# Patient Record
Sex: Male | Born: 1955 | Race: White | Hispanic: No | Smoking: Former smoker
Health system: Southern US, Community
[De-identification: ages and names within clinical notes are randomized; demographics above are authoritative.]

## PROBLEM LIST (undated history)

## (undated) DIAGNOSIS — I6529 Occlusion and stenosis of unspecified carotid artery: Secondary | ICD-10-CM

## (undated) DIAGNOSIS — J449 Chronic obstructive pulmonary disease, unspecified: Secondary | ICD-10-CM

## (undated) DIAGNOSIS — M79606 Pain in leg, unspecified: Secondary | ICD-10-CM

## (undated) DIAGNOSIS — I739 Peripheral vascular disease, unspecified: Secondary | ICD-10-CM

## (undated) DIAGNOSIS — S31109A Unspecified open wound of abdominal wall, unspecified quadrant without penetration into peritoneal cavity, initial encounter: Secondary | ICD-10-CM

## (undated) DIAGNOSIS — R42 Dizziness and giddiness: Secondary | ICD-10-CM

## (undated) DIAGNOSIS — M79673 Pain in unspecified foot: Secondary | ICD-10-CM

## (undated) DIAGNOSIS — I10 Essential (primary) hypertension: Secondary | ICD-10-CM

## (undated) DIAGNOSIS — C801 Malignant (primary) neoplasm, unspecified: Secondary | ICD-10-CM

## (undated) DIAGNOSIS — E785 Hyperlipidemia, unspecified: Secondary | ICD-10-CM

## (undated) DIAGNOSIS — Z5189 Encounter for other specified aftercare: Secondary | ICD-10-CM

## (undated) HISTORY — DX: Occlusion and stenosis of unspecified carotid artery: I65.29

## (undated) HISTORY — DX: Pain in unspecified foot: M79.673

## (undated) HISTORY — DX: Encounter for other specified aftercare: Z51.89

## (undated) HISTORY — DX: Hyperlipidemia, unspecified: E78.5

## (undated) HISTORY — DX: Chronic obstructive pulmonary disease, unspecified: J44.9

## (undated) HISTORY — DX: Essential (primary) hypertension: I10

## (undated) HISTORY — DX: Peripheral vascular disease, unspecified: I73.9

## (undated) HISTORY — DX: Pain in leg, unspecified: M79.606

---

## 2010-10-24 ENCOUNTER — Ambulatory Visit (HOSPITAL_COMMUNITY)
Admission: RE | Admit: 2010-10-24 | Discharge: 2010-10-24 | Disposition: A | Discharge status: Home / Self Care | Payer: Self-pay | Source: Ambulatory Visit | Attending: Family Medicine | Admitting: Family Medicine

## 2010-10-24 DIAGNOSIS — M79609 Pain in unspecified limb: Secondary | ICD-10-CM | POA: Insufficient documentation

## 2010-10-24 DIAGNOSIS — I70219 Atherosclerosis of native arteries of extremities with intermittent claudication, unspecified extremity: Secondary | ICD-10-CM | POA: Insufficient documentation

## 2010-10-24 DIAGNOSIS — M310 Hypersensitivity angiitis: Secondary | ICD-10-CM

## 2010-10-24 DIAGNOSIS — R0989 Other specified symptoms and signs involving the circulatory and respiratory systems: Secondary | ICD-10-CM

## 2010-11-16 ENCOUNTER — Encounter: Payer: Self-pay | Admitting: Vascular Surgery

## 2010-11-22 ENCOUNTER — Encounter (INDEPENDENT_AMBULATORY_CARE_PROVIDER_SITE_OTHER): Payer: Self-pay | Admitting: Vascular Surgery

## 2010-11-22 DIAGNOSIS — I7092 Chronic total occlusion of artery of the extremities: Secondary | ICD-10-CM

## 2010-11-22 DIAGNOSIS — M79673 Pain in unspecified foot: Secondary | ICD-10-CM

## 2010-11-22 DIAGNOSIS — I6529 Occlusion and stenosis of unspecified carotid artery: Secondary | ICD-10-CM

## 2010-11-22 HISTORY — DX: Pain in unspecified foot: M79.673

## 2010-11-23 NOTE — Assessment & Plan Note (Signed)
OFFICE VISIT  Eric Padilla, Eric Padilla DOB:  02-09-1956                                       11/22/2010 CHART#:[id number]  CHIEF COMPLAINT:  Leg pain.  HISTORY OF PRESENT ILLNESS:  The patient is a 55 year old male referred by Dr. Valente David for evaluation of lower extremity claudication symptoms. The patient complains that he gets bilateral calf pain after walking 1 block.  He states that this has been going on for about a year but has been worse since February of 2012.  He currently smokes half pack of cigarettes per day.  He also was recently started on aspirin 325 mg once a day.  He denies any rest pain.  He denies any prior ulcerations on the feet.  He has no other medical problems listed.  He has had no prior surgical procedures.  SOCIAL HISTORY:  He is single.  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  He has 1 child.  He smokes currently half pack cigarettes per day.  He drinks 2 beers per day.  FAMILY HISTORY:  Not remarkable for vascular disease at young age.  REVIEW OF SYSTEMS:  Multiple joint and muscle pain and leg pain all as listed above.  He is 5 feet 10 inches, 145 pounds.  All other review of systems are negative.  MEDICATIONS:  Aspirin.  ALLERGIES:  No known drug allergies.  PHYSICAL EXAM:  Vital signs:  Blood pressure is 151/72 in the right arm, 147/77 in the left arm, oxygen saturation is 98% on room air, heart rate is 110 and regular.  HEENT:  Unremarkable.  Neck:  Has 2+ carotid pulses with a harsh right-sided bruit.  Cardiac:  Regular rate and rhythm without murmur.  Abdomen:  Soft, nontender, nondistended.  No pulsatile mass.  Musculoskeletal:  No major joint deformities.  Neurologic:  He has symmetric upper extremity and lower extremity motor strength which is 5/5.  Skin:  Has no open ulcers or rashes.  Peripheral vascular:  He has 2+ radial pulses bilaterally.  He has a 2+ right femoral pulse.  He has absent left  femoral pulse.  He has absent popliteal and pedal pulses bilaterally.  The right fifth toe is slightly dusky in appearance. There are no open ulcers on the feet.  I reviewed his bilateral ABIs performed at [facility] on 10/24/2010 which shows an ABI on the right of 0.43 and on the left of 0.58.  These were monophasic waveforms.  He also had a carotid duplex exam which showed bilateral 40%-60% internal carotid artery stenosis with bilateral external carotid artery stenosis.  In summary, as far as the patient's carotid stenosis is concerned it is moderate in nature and he probably needs a repeat carotid duplex exam within the next 6-12 months.  As far as his claudication symptoms are concerned these are fairly disabling to him and he has fairly short distance walking.  I believe the best option at this point would be for an arteriogram/bilateral lower extremity runoff, possible angioplasty and stenting of his lower extremities.  If he needs an operation to revascularize his legs then we would need to consider cardiology evaluation prior to revascularization operation.  Smoking cessation counseling was also given today for greater than 3 minutes.  Risks, benefits, possible complications and procedure details of the arteriogram were explained to the  patient today.  He understands and agrees to proceed.  This is scheduled for 11/30/2010.    Janetta Hora. Fields, MD Electronically Signed  CEF/MEDQ  D:  11/22/2010  T:  11/23/2010  Job:  [id number]  cc:   Dr Wende Bushy

## 2010-11-30 ENCOUNTER — Ambulatory Visit (HOSPITAL_COMMUNITY)
Admission: RE | Admit: 2010-11-30 | Discharge: 2010-12-01 | Disposition: A | Discharge status: Home / Self Care | Payer: Medicaid Other | Source: Ambulatory Visit | Attending: Vascular Surgery | Admitting: Vascular Surgery

## 2010-11-30 ENCOUNTER — Ambulatory Visit (HOSPITAL_COMMUNITY): Payer: Medicaid Other

## 2010-11-30 DIAGNOSIS — I10 Essential (primary) hypertension: Secondary | ICD-10-CM

## 2010-11-30 DIAGNOSIS — Z01810 Encounter for preprocedural cardiovascular examination: Secondary | ICD-10-CM

## 2010-11-30 DIAGNOSIS — I70219 Atherosclerosis of native arteries of extremities with intermittent claudication, unspecified extremity: Secondary | ICD-10-CM

## 2010-11-30 DIAGNOSIS — I708 Atherosclerosis of other arteries: Secondary | ICD-10-CM | POA: Insufficient documentation

## 2010-11-30 DIAGNOSIS — Z01818 Encounter for other preprocedural examination: Secondary | ICD-10-CM | POA: Insufficient documentation

## 2010-11-30 DIAGNOSIS — R079 Chest pain, unspecified: Secondary | ICD-10-CM

## 2010-11-30 LAB — CARDIAC PANEL(CRET KIN+CKTOT+MB+TROPI): Troponin I: <0.3 ng/mL (ref <0.30)

## 2010-11-30 LAB — POCT I-STAT, CHEM 8
Glucose, Bld: 92 mg/dL (ref 70–99)
HCT: 50 % (ref 39.0–52.0)
Hemoglobin: 17 g/dL (ref 13.0–17.0)
Potassium: 4 mEq/L (ref 3.5–5.1)

## 2010-12-01 ENCOUNTER — Ambulatory Visit (HOSPITAL_COMMUNITY): Payer: Medicaid Other

## 2010-12-01 ENCOUNTER — Other Ambulatory Visit (HOSPITAL_COMMUNITY): Payer: Self-pay

## 2010-12-01 LAB — LIPID PANEL
HDL: 42 mg/dL (ref >39)
LDL Cholesterol: 120 mg/dL — ABNORMAL HIGH (ref 0–99)
Triglycerides: 92 mg/dL (ref <150)
VLDL: 18 mg/dL (ref 0–40)

## 2010-12-01 LAB — BASIC METABOLIC PANEL
CO2: 28 mEq/L (ref 19–32)
Chloride: 103 mEq/L (ref 96–112)
Glucose, Bld: 101 mg/dL — ABNORMAL HIGH (ref 70–99)
Potassium: 4.2 mEq/L (ref 3.5–5.1)
Sodium: 138 mEq/L (ref 135–145)

## 2010-12-01 LAB — CARDIAC PANEL(CRET KIN+CKTOT+MB+TROPI): Total CK: 51 U/L (ref 7–232)

## 2010-12-01 LAB — CBC
HCT: 44.6 % (ref 39.0–52.0)
Hemoglobin: 15.5 g/dL (ref 13.0–17.0)
MCH: 33.7 pg (ref 26.0–34.0)
MCHC: 34.8 g/dL (ref 30.0–36.0)

## 2010-12-01 MED ORDER — TECHNETIUM TC 99M TETROFOSMIN IV KIT
30.0000 | PACK | Freq: Once | INTRAVENOUS | Status: AC | PRN
  Administered 2010-12-01: 30 via INTRAVENOUS

## 2010-12-01 MED ORDER — TECHNETIUM TC 99M TETROFOSMIN IV KIT
10.0000 | PACK | Freq: Once | INTRAVENOUS | Status: AC | PRN
  Administered 2010-12-01: 08:00:00 10 via INTRAVENOUS

## 2010-12-11 NOTE — Consult Note (Signed)
Eric Padilla, KERSTEN NO.:  [id number]  MEDICAL RECORD NO.:  [id number]  LOCATION:  6523                         FACILITY:  [facility]  PHYSICIAN:  Cassell Clement, M.D. DATE OF BIRTH:  22-Sep-1955  DATE OF CONSULTATION:  11/30/2010 DATE OF DISCHARGE:                                CONSULTATION   PRIMARY CARE PROVIDER:  [facility].  CARDIOLOGIST:  None.  VASCULAR SURGEON:  Janetta Hora. Fields, MD  We are asked to see this pleasant 55 year old divorced homeless Caucasian male who is being admitted for evaluation of claudication of both legs, but worse on the right.  He will need a femoropopliteal bypass graft surgery following results from his angiogram earlier today. We have been asked to see him for preoperative clearance.  The patient does not have any history of known coronary disease.  He did have an episode of chest pain yesterday for the first time.  It occurred after walking in the heat in the early afternoon.  His pain was in the left side of his chest, lasted about 30 minutes, was associated with dizziness, but no nausea or vomiting.  He was mildly short of breath. He did not have any radiation to the left arm.  He does not have any prior history of known coronary disease.  He states that he does not have any known history of high cholesterol or of high blood pressure, and he is not diabetic.  He used to be a heavy smoker and more recently has been smoking about half-a-pack a day and his trying to quit smoking.  FAMILY HISTORY:  Reveals that his father had coronary artery bypass graft surgery in his late 4s and lived until 4.  The patient's mother died at 71 of a stroke.  SOCIAL HISTORY:  Reveals that he is divorced.  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  He is homeless.  He stayed in the winter emergency shelter until March 31 and then was sleeping out on the streets until last night when he slept at the  [employer] for the first time.  The patient drinks about 2 beers some nights, but not every night.  PAST MEDICAL HISTORY:  Otherwise, noncontributory.  MEDICATIONS:  Daily aspirin.  ALLERGIES:  No known drug allergies.  REVIEW OF SYSTEMS:  All other systems negative in detail.  He does have known carotid bruits, but has not been having TIA symptoms.  PHYSICAL EXAMINATION:  VITAL SIGNS:  Blood pressure is 138/71, pulse is 77 and regular, O2 saturation is 95% on room air. HEENT:  The patient has bilateral carotid bruits, right greater than left.  No lymphadenopathy.  Pupils were equal and reactive.  Extraocular movements are full.  Jugular venous pressure is normal. CHEST:  Clear to percussion and auscultation. HEART:  Reveals soft basilar systolic ejection murmur.  No diastolic murmur.  No gallop or rub. ABDOMEN:  Soft and nontender without masses or hepatomegaly. EXTREMITIES:  Show absent pedal pulses.  His electrocardiogram shows normal sinus rhythm and is within normal limits.  His electrolytes are normal with potassium 4.0, sodium 141, BUN 9, creatinine  0.8, blood sugar 92.  IMPRESSION: 1. Chest pain yesterday, uncertain etiology with no prior history of     known ischemic heart disease. 2. Severe peripheral arterial occlusive disease. 3. Known bilateral carotid bruits. 4. Tobacco abuse. 5. Homelessness  PLAN:  We will arrange for a Lexiscan Myoview for a preoperative clearance and this is on the schedule for Saturday morning, June 30.  We will also request a 2-dimensional echocardiogram.  We will check one set of cardiac enzymes in view of yesterday's chest pain.  We will also check lipid status.  At this point, we will continue aspirin, and if he has a recurrence of pain, we will give him a trial of sublingual nitroglycerin.          ______________________________ Cassell Clement, M.D.     TB/MEDQ  D:  11/30/2010  T:  12/01/2010  Job:  [id number]  cc:    Janetta Hora. Fields, MD [facility]  Electronically Signed by Cassell Clement M.D. on 12/11/2010 08:25:55 AM

## 2010-12-17 NOTE — Op Note (Signed)
Eric Padilla, Eric Padilla NO.:  [id number]  MEDICAL RECORD NO.:  [id number]  LOCATION:  6523                         FACILITY:  [facility]  PHYSICIAN:  Janetta Hora. Esta Carmon, MD  DATE OF BIRTH:  May 26, 1956  DATE OF PROCEDURE:  11/30/2010 DATE OF DISCHARGE:                              OPERATIVE REPORT   PROCEDURE:  Aortogram with bilateral lower extremity runoff.  PREOPERATIVE DIAGNOSIS:  Bilateral lower extremity claudication.  POSTOPERATIVE DIAGNOSIS:  Bilateral lower extremity claudication.  ANESTHESIA:  Local with IV sedation.  OPERATIVE DETAILS:  After obtaining informed consent, the patient taken to the PV Lab.  The patient placed in supine position on the angio table.  Both groins were prepped and draped in the usual sterile fashion.  Local anesthesia was infiltrated over the right common femoral artery.  An introducer needle was used to cannulate the right common femoral artery, and a 0.035 J-tip guidewire threaded into the abdominal aorta under fluoroscopic guidance.  Next, a 5-French sheath placed over the guidewire in the right common femoral artery, and this was thoroughly flushed with heparinized saline.  A 5-French pigtail catheter was then placed over the guidewire and into the abdominal aorta and abdominal aortogram was obtained.  This shows bilateral single renal arteries, which are patent.  There is mild-to-moderate atherosclerotic change, but no focal narrowing of the abdominal aorta.  The left common iliac artery is occluded just after its origin.  The right common iliac artery is calcified but patent.  The right external and internal iliac artery is patent.  The left internal iliac artery does fill retrograde from the left external iliac artery.  The left external iliac artery is filled and reconstituted distally primarily by lumbar collaterals.  Bilateral lower extremity runoff views were obtained after obtaining 30- degree RAO and LAO views of  the pelvis, which confirmed the above findings.  In the lower extremity runoff views, the right common femoral artery is patent.  The right profunda femoris artery has a 90% stenosis at its origin.  This is of a secondary branch, however.  The main profunda trunk is widely patent.  The right superficial femoral artery occludes at its origin.  The right superficial femoral artery reconstitutes via profunda collaterals in the mid leg.  The above-knee popliteal artery is patent.  The vessel is of better quality at the level of the femoral condyle.  The below-knee popliteal artery is patent.  There is three- vessel runoff to the right foot.  This was primarily via the peroneal artery.  There is sluggish flow in the posterior tibial and anterior tibial artery.  In the left lower extremity, the left common femoral artery is patent. The left profunda femoris and superficial femoral arteries are calcified but patent.  There is some narrowing of the tibioperoneal trunk on the left side.  However, there is three-vessel runoff to the left foot,although there is under filling most likely due to the left common iliac artery occlusion.  The patient tolerated the procedure well and there were no complications.  The pigtail catheter was removed over a guidewire.  The 5-French sheath was left in place to be pulled in  the holding area.  OPERATIVE FINDINGS: 1. Occlusion of left common iliac artery. 2. In-line flow other than the common iliac occlusion in the left     lower extremity with three-vessel runoff to the left foot.  Right superficial femoral artery occlusion with three-vessel runoff to the right foot with sluggish flow in the anterior tibial artery to the right foot.  The patient will be scheduled for a femoral-femoral bypass and right fem- pop bypass in the near future after cardiac risk stratification. [facility] was contacted regarding this today.  The patient will spend the  night, tonight in the hospital since he is homeless and does not have anywhere to stay for this evening.     Janetta Hora. Candy Ziegler, MD     CEF/MEDQ  D:  11/30/2010  T:  12/01/2010  Job:  [id number]  Electronically Signed by Fabienne Bruns MD on 12/17/2010 09:52:41 AM

## 2010-12-27 ENCOUNTER — Encounter: Payer: Self-pay | Admitting: Cardiology

## 2011-01-01 ENCOUNTER — Encounter (HOSPITAL_COMMUNITY)
Admission: RE | Admit: 2011-01-01 | Discharge: 2011-01-01 | Disposition: A | Discharge status: Home / Self Care | Payer: Medicaid Other | Source: Ambulatory Visit | Attending: Vascular Surgery | Admitting: Vascular Surgery

## 2011-01-01 ENCOUNTER — Ambulatory Visit (HOSPITAL_COMMUNITY)
Admission: RE | Admit: 2011-01-01 | Discharge: 2011-01-01 | Disposition: A | Discharge status: Home / Self Care | Payer: Medicaid Other | Source: Ambulatory Visit | Attending: Vascular Surgery | Admitting: Vascular Surgery

## 2011-01-01 ENCOUNTER — Other Ambulatory Visit: Payer: Self-pay | Admitting: Vascular Surgery

## 2011-01-01 DIAGNOSIS — Z01818 Encounter for other preprocedural examination: Secondary | ICD-10-CM | POA: Insufficient documentation

## 2011-01-01 DIAGNOSIS — I739 Peripheral vascular disease, unspecified: Secondary | ICD-10-CM

## 2011-01-01 LAB — CBC
HCT: 44.9 % (ref 39.0–52.0)
Hemoglobin: 15.9 g/dL (ref 13.0–17.0)
MCH: 34.3 pg — ABNORMAL HIGH (ref 26.0–34.0)
MCHC: 35.4 g/dL (ref 30.0–36.0)
MCV: 96.8 fL (ref 78.0–100.0)
Platelets: 349 K/uL (ref 150–400)
RBC: 4.64 MIL/uL (ref 4.22–5.81)
RDW: 14 % (ref 11.5–15.5)
WBC: 10.3 K/uL (ref 4.0–10.5)

## 2011-01-01 LAB — ABO/RH: ABO/RH(D): A NEG

## 2011-01-01 LAB — COMPREHENSIVE METABOLIC PANEL WITH GFR
ALT: 17 U/L (ref 0–53)
AST: 16 U/L (ref 0–37)
Albumin: 3.9 g/dL (ref 3.5–5.2)
Alkaline Phosphatase: 111 U/L (ref 39–117)
BUN: 13 mg/dL (ref 6–23)
CO2: 27 meq/L (ref 19–32)
Calcium: 10.3 mg/dL (ref 8.4–10.5)
Chloride: 104 meq/L (ref 96–112)
Creatinine, Ser: 0.61 mg/dL (ref 0.50–1.35)
GFR calc Af Amer: >60 mL/min
GFR calc non Af Amer: >60 mL/min
Glucose, Bld: 77 mg/dL (ref 70–99)
Potassium: 5.2 meq/L — ABNORMAL HIGH (ref 3.5–5.1)
Sodium: 141 meq/L (ref 135–145)
Total Bilirubin: 0.3 mg/dL (ref 0.3–1.2)
Total Protein: 7 g/dL (ref 6.0–8.3)

## 2011-01-01 LAB — URINALYSIS, ROUTINE W REFLEX MICROSCOPIC
Bilirubin Urine: NEGATIVE
Glucose, UA: NEGATIVE mg/dL
Hgb urine dipstick: NEGATIVE
Ketones, ur: NEGATIVE mg/dL
Leukocytes, UA: NEGATIVE
Nitrite: NEGATIVE
Protein, ur: NEGATIVE mg/dL
Specific Gravity, Urine: 1.021 (ref 1.005–1.030)
Urobilinogen, UA: 1 mg/dL (ref 0.0–1.0)
pH: 7.5 (ref 5.0–8.0)

## 2011-01-01 LAB — PROTIME-INR: Prothrombin Time: 12.9 seconds (ref 11.6–15.2)

## 2011-01-07 ENCOUNTER — Inpatient Hospital Stay (HOSPITAL_COMMUNITY): Payer: Medicaid Other

## 2011-01-07 ENCOUNTER — Inpatient Hospital Stay (HOSPITAL_COMMUNITY)
Admission: RE | Admit: 2011-01-07 | Discharge: 2011-01-11 | DRG: 253 | Disposition: A | Discharge status: Home / Self Care | Payer: Medicaid Other | Source: Ambulatory Visit | Attending: Vascular Surgery | Admitting: Vascular Surgery

## 2011-01-07 ENCOUNTER — Other Ambulatory Visit: Payer: Self-pay | Admitting: Vascular Surgery

## 2011-01-07 DIAGNOSIS — Z5900 Homelessness unspecified: Secondary | ICD-10-CM

## 2011-01-07 DIAGNOSIS — Z7982 Long term (current) use of aspirin: Secondary | ICD-10-CM

## 2011-01-07 DIAGNOSIS — I10 Essential (primary) hypertension: Secondary | ICD-10-CM | POA: Diagnosis present

## 2011-01-07 DIAGNOSIS — I498 Other specified cardiac arrhythmias: Secondary | ICD-10-CM | POA: Diagnosis not present

## 2011-01-07 DIAGNOSIS — I70219 Atherosclerosis of native arteries of extremities with intermittent claudication, unspecified extremity: Principal | ICD-10-CM | POA: Diagnosis present

## 2011-01-07 DIAGNOSIS — Z01810 Encounter for preprocedural cardiovascular examination: Secondary | ICD-10-CM

## 2011-01-07 DIAGNOSIS — Z01812 Encounter for preprocedural laboratory examination: Secondary | ICD-10-CM

## 2011-01-07 DIAGNOSIS — F17200 Nicotine dependence, unspecified, uncomplicated: Secondary | ICD-10-CM | POA: Diagnosis present

## 2011-01-07 DIAGNOSIS — Z79899 Other long term (current) drug therapy: Secondary | ICD-10-CM

## 2011-01-07 DIAGNOSIS — D62 Acute posthemorrhagic anemia: Secondary | ICD-10-CM | POA: Diagnosis not present

## 2011-01-07 HISTORY — PX: PR VEIN BYPASS GRAFT,AORTO-FEM-POP: 35551

## 2011-01-08 LAB — BASIC METABOLIC PANEL
BUN: 7 mg/dL (ref 6–23)
CO2: 27 mEq/L (ref 19–32)
Chloride: 102 mEq/L (ref 96–112)
Creatinine, Ser: <0.47 mg/dL — ABNORMAL LOW (ref 0.50–1.35)
Glucose, Bld: 140 mg/dL — ABNORMAL HIGH (ref 70–99)
Potassium: 3.7 mEq/L (ref 3.5–5.1)

## 2011-01-08 LAB — CBC
HCT: 26.2 % — ABNORMAL LOW (ref 39.0–52.0)
Hemoglobin: 9.2 g/dL — ABNORMAL LOW (ref 13.0–17.0)
MCV: 97.4 fL (ref 78.0–100.0)
RBC: 2.69 MIL/uL — ABNORMAL LOW (ref 4.22–5.81)
WBC: 9.5 10*3/uL (ref 4.0–10.5)

## 2011-01-09 DIAGNOSIS — I739 Peripheral vascular disease, unspecified: Secondary | ICD-10-CM

## 2011-01-09 LAB — CBC
MCH: 33.5 pg (ref 26.0–34.0)
MCHC: 34.6 g/dL (ref 30.0–36.0)
MCV: 96.8 fL (ref 78.0–100.0)
Platelets: 227 10*3/uL (ref 150–400)
RBC: 2.51 MIL/uL — ABNORMAL LOW (ref 4.22–5.81)

## 2011-01-10 LAB — POCT I-STAT 7, (LYTES, BLD GAS, ICA,H+H)
O2 Saturation: 100 %
Potassium: 4.6 mEq/L (ref 3.5–5.1)
Sodium: 139 mEq/L (ref 135–145)
TCO2: 26 mmol/L (ref 0–100)

## 2011-01-10 LAB — CBC
HCT: 21.1 % — ABNORMAL LOW (ref 39.0–52.0)
MCHC: 34.6 g/dL (ref 30.0–36.0)
MCV: 96.8 fL (ref 78.0–100.0)
Platelets: 217 10*3/uL (ref 150–400)
RDW: 13.9 % (ref 11.5–15.5)

## 2011-01-11 LAB — TYPE AND SCREEN
ABO/RH(D): A NEG
Unit division: 0

## 2011-01-11 LAB — CBC
HCT: 29.2 % — ABNORMAL LOW (ref 39.0–52.0)
Hemoglobin: 10.2 g/dL — ABNORMAL LOW (ref 13.0–17.0)
MCH: 32.7 pg (ref 26.0–34.0)
MCHC: 34.9 g/dL (ref 30.0–36.0)
MCV: 93.6 fL (ref 78.0–100.0)

## 2011-01-21 DIAGNOSIS — I70219 Atherosclerosis of native arteries of extremities with intermittent claudication, unspecified extremity: Secondary | ICD-10-CM

## 2011-01-29 ENCOUNTER — Encounter: Payer: Self-pay | Admitting: Thoracic Diseases

## 2011-01-30 ENCOUNTER — Encounter: Payer: Self-pay | Admitting: Thoracic Diseases

## 2011-01-31 ENCOUNTER — Ambulatory Visit (INDEPENDENT_AMBULATORY_CARE_PROVIDER_SITE_OTHER): Payer: PRIVATE HEALTH INSURANCE | Admitting: Thoracic Diseases

## 2011-01-31 VITALS — BP 138/111 | HR 118 | Resp 18 | Ht 71.0 in | Wt 145.0 lb

## 2011-01-31 DIAGNOSIS — I739 Peripheral vascular disease, unspecified: Secondary | ICD-10-CM

## 2011-01-31 NOTE — Op Note (Signed)
NAMEJAKIE, Eric Padilla NO.:  [id number]  MEDICAL RECORD NO.:  [id number]  LOCATION:  2027                         FACILITY:  [facility]  PHYSICIAN:  Janetta Hora. Fields, MD  DATE OF BIRTH:  18-May-1956  DATE OF PROCEDURE:  01/07/2011 DATE OF DISCHARGE:                              OPERATIVE REPORT   PROCEDURES: 1. Left-to-right femoral-femoral bypass with 8-mm Dacron. 2. Right femoral-to-above-knee popliteal bypass with reversed right     greater saphenous vein. 3. Right popliteal endarterectomy. 4. Patch angioplasty of right popliteal artery with vein.  PREOPERATIVE DIAGNOSIS:  Claudication, bilateral lower extremity.  POSTOPERATIVE DIAGNOSIS:  Claudication, bilateral lower extremity.  ANESTHESIA:  General.  SURGEON:  Charles E. Fields, MD  ASSISTANT:  Della Goo, PA-C and Di Kindle. Edilia Bo, MD  OPERATIVE FINDINGS: 1. A 3 mm right greater saphenous vein. 2. A 8-mm Dacron. 3. Three-vessel runoff to the right foot, although there is some     disease in the below-knee popliteal artery in the proximal tibial     vessels.  OPERATIVE DETAILS:  After obtaining informed consent, the patient was taken to the operating room.  The patient was placed in supine position on the operating table.  After induction of general anesthesia and endotracheal intubation, the patient was prepped and draped in usual sterile fashion from the umbilicus down to the toes bilaterally.  Next, an incision was made in longitudinal fashion on the right groin carried down through the subcutaneous tissues down to the level of the right common femoral artery.  This had a good pulse within it.  There was some posterior plaque.  The artery was dissected free circumferentially and the profunda femoris and two superficial femoral arteries were also dissected free circumferentially.  Dissection was carried up to the level of the inguinal ligament and the crossing venous branch over the  common femoral artery was dissected free circumferentially and ligated and divided between the silk ties.  Next, attention was turned to the left groin.  In similar fashion, longitudinal incision was made over the left common femoral artery, carried down through the subcutaneous tissues down to the level of the artery.  The artery was dissected free circumferentially.  There is calcified plaque proximally in the artery.  The artery was soft in the midportion of the common femoral artery.  The profunda femoris and superficial femoral arteries were dissected free circumferentially and vessel loops were placed around these.  Attention was then turned back to the right groin.  The right greater saphenous vein was then harvested through several skip incisions on the groin on the right leg from the saphenofemoral junction all the way down to the level of the knee.  The groin vein was of good quality, approximately 3 mm in diameter.  Small side branches of vein were ligated and divided between the silk ties or clips.  Next, the incision just above the knee from the saphenectomy site was deepened down into the above-knee popliteal space.  The above- knee popliteal artery was dissected free circumferentially and vessel loops were placed around this.  The artery was quite thickened and diseased in its external appearance.  However, there  were soft segments within it.  A subcutaneous tunnel was then created connecting the right groin incision to the left groin incision and a 8-mm Dacron graft was brought through the subcutaneous tunnel.  The patient was then given 7000 units of intravenous heparin.  The patient was also given several additional boluses of heparin during the course of the case.  Please see anesthesia record for details regarding the heparin dosing.  After appropriate circulation time of the heparin, the right common femoral artery was occluded proximally with the Cooley clamp.   The vessel loops were used to control the profunda femoris and superficial femoral arteries.  A longitudinal opening was made in the common femoral artery and the graft was beveled on one end and sewn end of graft to side of artery using a running 5-0 Prolene suture.  Just prior to completion of the anastomosis, this was forebled, backbled and thoroughly flushed.  Anastomosis was secured.  Clamps were released. There was a good pulsatile flow in the graft immediately.  Attention was then turned to the left groin.  In similar fashion, the common femoral arteries were controlled initially with a Cooley clamp.  This did not have good hemostasis, controlled with fistula clamp.  The profunda and superficial femoral arteries were controlled with vessel loops. Longitudinal opening was made in the common femoral artery.  The graft was pulled taut, cut to length, and beveled and sewn end graft to side of artery using a running 5-0 Prolene suture.  Just prior to completion of the anastomosis, this was forebled, backbled and thoroughly flushed. Anastomosis was secured.  Clamps were released.  There was good pulsatile flow in the femoral artery immediately.  Thrombin and Gelfoam were packed in both groin incisions.  Attention was then turned back to the right groin.  After hemostasis had been obtained in the right groin, the femoral-femoral bypass was controlled with a fistula clamp and the common femoral artery was re-controlled with a Cooley clamp and the profunda femoris and superficial femoral arteries were controlled distally with vessel loops.  Longitudinal opening was made in the hood of the femoral-femoral bypass graft.  The vein was transected at the level of the knee joint and oversewn with a 5-0 Prolene at the saphenofemoral junction.  The vein was placed in the reverse configuration and spatulated.  It was then sewn end of vein to side of the hood of the femoral-femoral graft in the right  groin using a running 6-0 Prolene suture.  Just prior to completion of the anastomosis, this was forebled, backbled and thoroughly flushed.  Anastomosis was secured. Clamps were released.  There was pulsatile flow in the vein graft immediately.  This was marked for orientation.  The deep tunnel was then created from the above-knee popliteal space to the groin below the sartorius muscle.  The bypass graft was brought through this tunnel and attention was turned to the above-knee popliteal artery.  This was controlled proximally and distally with fine bulldog clamps.  A longitudinal opening was made in the above-knee popliteal artery.  There was some posterior wall plaque, but I was able to pass up to a 4.5-mm dilator through the artery and thought this was a reasonable segment for the distal anastomosis.  The vein graft was cut to length and the vein sewn end of vein to side of artery using a running 6-0 Prolene suture. Just prior to completion of the anastomosis, this was forebled, backbled and thoroughly flushed.  Anastomosis was secured.  Clamps were released. There was pulsatile flow in the graft immediately, but the pulse distal to the graft was not very good quality.  There were Doppler signals in the posterior tibial and dorsalis pedis artery.  At this point, an intraoperative arteriogram was performed by introducing a 21-gauge butterfly needle in the proximal aspect of the vein graft and with inflow occlusion.  Arteriogram was performed injecting contrast through the vein graft.  The distal anastomosis was visualized and there was exophytic plaque, which was obstructing approximately 80% of the limb into the bypass.  I did not feel that this was going to be good for long term patency.  Therefore, attention was turned back to the distal anastomosis.  The patient was re-bolused with heparin.  The vein graft was controlled proximally and a longitudinal opening was made in the hood of  the vein graft and extended down onto the artery.  There was exophytic plaque along the posterior wall and the artery was fairly diseased with thickened atherosclerotic plaque.  I then proceeded to do a popliteal endarterectomy to remove this focal segment of atherosclerotic plaque.  Fairly reasonable distal endpoint was obtained, but I did tack this down posteriorly with several 7-0 Prolene sutures. An additional piece of vein was harvested from an incision just below the knee and this was placed in a reverse configuration and opened longitudinally and then sewn on as a vein patch with a patch extending from the distal above-the-knee popliteal artery all the way up onto the hood of the preexisting vein graft.  Just prior to completion of the anastomosis, everything was forebled, backbled and thoroughly flushed. Anastomosis was secured.  Clamps were released.  There was good pulsatile flow in the graft immediately.  A repeat arteriogram was then performed, which showed a widely patent distal anastomosis with three- vessel runoff, although there were some atherosclerotic occlusive disease in the proximal aspect of the tibial vessels as well as the distal below-the-knee popliteal artery.  At this point, hemostasis was obtained in all incisions.  Groins were both closed with multiple layers of running 2-0 and 3-0 Vicryl suture and 4-0 Vicryl subcuticular stitch in the skin.  The subcutaneous tissues of the saphenectomy site were closed with multiple layers of running 3-0 Vicryl suture and 4-0 Vicryl subcuticular stitch in the skin.  The above-the-knee popliteal incision was closed with a running 2-0 Vicryl suture in the deep layer and then a 3-0 subcutaneous layer and a 4-0 Vicryl subcuticular stitch in the skin. Dermabond was applied to the incisions.  The patient tolerated the procedure well and there were no complications.  Instrument, sponge, and needle count was correct at the end of the  case.  The patient had posterior tibial and dorsalis pedis Doppler signals at the end of the case in both feet.     Janetta Hora. Fields, MD     CEF/MEDQ  D:  01/08/2011  T:  01/08/2011  Job:  [id number]  Electronically Signed by Fabienne Bruns MD on 01/31/2011 06:26:00 PM

## 2011-01-31 NOTE — Patient Instructions (Signed)
See letter under communication for wound care

## 2011-01-31 NOTE — Progress Notes (Signed)
[facility] [place]  Postoperative Visit Bypass Surgery  History of Present Illness  Eric Padilla is a 55 y.o. male who presents for postoperative follow-up for: left to right Fem- Fem bypass and right fem popVascular Bypass with vein on (Date: 01/07/11).  The patient's wounds are healed except for the distal harvest site wound which has been draining"bloody fluid" since last week. The pt. Has been putting dry guaze on it. He denies fever or chills. He is still having some incisional pain but states his legs feel overall better than pre-op.  Patients pain is well controlled.  The patient's pre-operative symptoms of pain and claudication are Improved.  Physical Examination  Filed Vitals:   01/31/11 1334  BP: 138/111  Pulse: 118  Resp: 18    Pt is A&O x 3 Gait is normal right lower extremity:distal thigh Incision/s is erythematous, draining and partially separated with approx 2x3 cm pocket, there is and area of induration with no erythema around this wound.  Probing the wound and opening of it produced minimal serous drainage. The other wounds are healing without signs of infection. Both groin wounds are healing well. The fem-fem graft is palp.  Dorsalis Pedis pulse is present and palpable bilaterally  Medical Decision Making  Eric Padilla is a 54 y.o. year old male who presents s/p left to right fem fem bypass and right lower extremity fem-pop bypass surgery .  The patient's bypass incisions are healed with exception of distal thigh wound on the right with open draining area  He hasgood resolution of pre-operative symptoms.  We will request [facility] nursing for wet to dry dressing changes daily- this may be difficult as Pt. Is homeless and lives at the [facility].facility. Pt was given a letter for the facility at which he is residing. He states there is a nurse there who can help set up his care.  We will see him again next week to assess wound.  Cipro 500mg   BID x 10 days  vicodin 5/500mg  # 20 given for pain

## 2011-01-31 NOTE — Discharge Summary (Signed)
NAME:  Eric Padilla, Eric Padilla NO.:  [id number]  MEDICAL RECORD NO.:  [id number]  LOCATION:                                 FACILITY:  PHYSICIAN:  Charles E. Fields, MD  DATE OF BIRTH:  January 29, 1956  DATE OF ADMISSION:  01/07/2011 DATE OF DISCHARGE:                        DISCHARGE SUMMARY - REFERRING   ADMIT DIAGNOSIS:  Bilateral lower extremity claudication, right greater than left with peripheral arterial disease.  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  ALLERGIES:  No known drug allergies.  BRIEF HISTORY:  The patient is a 55 year old male who was referred to Dr. Darrick Penna by Dr. Valente David for evaluation of lower extremity claudication symptoms.  The patient complained that he had bilateral calf pain after walking one block.  He stated that has been present for about a year and had gotten worse since February 2012.  The patient does smoke half pack a day.  After evaluation in the office by Dr. Darrick Penna, he felt that the patient should proceed with arteriogram to evaluate the severity of his PAD.  This was then performed on November 30, 2010 by Dr. Darrick Penna.  This revealed an occlusion of the left common iliac artery as well as right superficial femoral artery occlusion with three-vessel runoff to the foot.  The patient was therefore scheduled for surgical revascularization.  HOSPITAL COURSE:  The patient was admitted and taken to the OR on January 07, 2011 for a left-to-right femoral-femoral bypass with an 8-mm Dacron, right femoral to above-knee popliteal bypass with reverse greater saphenous vein, right popliteal artery  endarterectomy, and patch angioplasty of the right popliteal artery with vein.  The patient tolerated the procedure well and was hemodynamically stable immediately postoperatively.  He was transferred from the OR to the post anesthesia care unit in stable addition.  The patient was extubated without complication and woke up from anesthesia neurologically intact.  The patient's postoperative course progressed as expected.  He did develop an acute blood loss postoperative anemia for which he was transfused.  His H and H responded appropriately.  He has tolerated this well.  The patient has had some sinus tachycardia and hypertension postoperatively, both of which responded well to beta-blocker.  The patient did receive cardiac clearance prior to surgery and he has done well from a cardiac standpoint postoperatively.  The patient has been able to ambulate with physical therapy without difficulty.  He is tolerating regular diet and voiding also without difficulty.  The patient has been given a nicotine patch aid with his smoking cessation.  On January 11, 2011, postop day #4, the patient is without complaint.  He is ambulating well.  He is afebrile with stable vital signs.  PHYSICAL EXAMINATION:  The right lower extremity is well perfused with palpable right dorsalis pedis pulse.  Incision is clean, dry, and intact.  He does have tape blisters on the medial aspect of the  right side. The femoral-to-femoral bypass graft has a palpable pulse.  There is some ecchymosis present in the groin.  The patient also had some swelling in his right foot.  The patient is in stable condition at this time and is felt ready for discharge.  Social work has been working very closely with the patient as he is homeless.  He will be discharged to the [facility] project.  They have arranged for the patient to live in a hotel for approximately 2 weeks after discharge and will help with his  discharge medications.  LABORATORY DATA:  CBC on January 11, 2011, white count 7.7, hemoglobin 7.2, hematocrit 29.2, and platelets 278.  BMP on January 08, 2011, sodium 136, potassium 3.7, BUN 7, and creatinine 0.47.  DISCHARGE INSTRUCTIONS:  The patient received specific written discharge instructions regarding diet, activity, and wound care.  He will follow up with Dr. Darrick Penna in approximately 4 weeks.  The patient is to call us if any question, issues, or problems in the interim.  DISCHARGE MEDICATIONS: 1. Aspirin 81 mg daily. 2. Metoprolol 12.5 mg b.i.d. 3. Nicotine patch 21 mg q.24 h. x6 weeks, then 14 mg patch x2 weeks,     and then 7 mg patch x2 weeks. 4. Oxycodone 5 mg 1-2 q.4-6 h. p.r.n. pain.     [name], PA   ______________________________ Janetta Hora Fields, MD    AY/MEDQ  D:  01/11/2011  T:  01/11/2011  Job:  [id number]  Electronically Signed by [name] PA on 01/15/2011 09:51:13 AM Electronically Signed by Fabienne Bruns MD on 01/31/2011 06:25:56 PM

## 2011-02-07 ENCOUNTER — Ambulatory Visit: Payer: PRIVATE HEALTH INSURANCE

## 2011-02-08 ENCOUNTER — Encounter: Payer: Self-pay | Admitting: Thoracic Diseases

## 2011-02-08 ENCOUNTER — Ambulatory Visit (INDEPENDENT_AMBULATORY_CARE_PROVIDER_SITE_OTHER): Payer: PRIVATE HEALTH INSURANCE | Admitting: Thoracic Diseases

## 2011-02-08 VITALS — HR 91 | Temp 98.5°F | Resp 18

## 2011-02-08 DIAGNOSIS — I739 Peripheral vascular disease, unspecified: Secondary | ICD-10-CM

## 2011-02-08 DIAGNOSIS — T8131XA Disruption of external operation (surgical) wound, not elsewhere classified, initial encounter: Secondary | ICD-10-CM

## 2011-02-08 NOTE — Progress Notes (Addendum)
History of Present Illness  Eric Padilla is a 55 y.o. male who presents for postoperative follow-up for: left to right Fem- Fem bypass and right fem popVascular Bypass with vein on (Date: 01/07/11). The patient's wounds are healed except for the distal harvest site wound which was I&D'd last week he returns for F/U . The pt. Has been putting wet-dry guaze in the wound and has continued on Cipro. He states the wound feels better with decreased pain except at night. He is walking without difficulty He denies fever or chills. He is still having some incisional pain but states his legs feel overall better than pre-op.  The patient's pre-operative symptoms of pain and claudication are Improved.   PE: WDWN male in NAD Gait normal Right LE wounds are all healing well with decreased induration. Erythema resolved at open wound. Small open wound approx 1x2 is granulating in nicely. He has dry scabs over some of hiss other wounds. Good doppler flow in BLE - no change  A/P   continue dressing changes  Continue Cipro  F/U 2 weeks with Dr. Darrick Penna Tramadol 20 tabs given 1 tab po q 6h

## 2011-02-11 DIAGNOSIS — I739 Peripheral vascular disease, unspecified: Secondary | ICD-10-CM

## 2011-02-27 ENCOUNTER — Encounter: Payer: Self-pay | Admitting: Vascular Surgery

## 2011-02-28 ENCOUNTER — Ambulatory Visit (INDEPENDENT_AMBULATORY_CARE_PROVIDER_SITE_OTHER): Payer: PRIVATE HEALTH INSURANCE | Admitting: Vascular Surgery

## 2011-02-28 ENCOUNTER — Encounter: Payer: Self-pay | Admitting: Vascular Surgery

## 2011-02-28 VITALS — BP 166/91 | HR 82 | Resp 16 | Ht 71.0 in | Wt 145.0 lb

## 2011-02-28 DIAGNOSIS — I70219 Atherosclerosis of native arteries of extremities with intermittent claudication, unspecified extremity: Secondary | ICD-10-CM

## 2011-02-28 NOTE — Progress Notes (Signed)
Patient is a 55 year old male who is status post left to right femoral-femoral bypass and a right femoral to above-knee popliteal bypass with reversed right greater saphenous vein. Also the right popliteal endarterectomy and patch angioplasty of the right popliteal artery with vein. Patient returns for further followup today to recheck incisions in his right leg. He states that his claudication symptoms have completely resolved. He has had no drainage from his incisions. He had no fever or chills.  Physical exam: Filed Vitals:   02/28/11 0919  BP: 166/91  Pulse: 82  Resp: 16  Height: 5\' 11"  (1.803 m)  Weight: 145 lb (65.772 kg)   Abdomen: 2+ graft pulse in his femoral-femoral bypass, monophasic dorsalis pedis and posterior tibial Doppler flow right foot, biphasic posterior tibial and dorsalis pedis Doppler flow left foot, overall both feet well perfused no ulcerations there is one saphenectomy incision on the mid medial right thigh which has some eschar on but overall this is healing does not appear to have any infection  Assessment: Doing well status post femoral-femoral bypass and right femoropopliteal with no claudication symptoms at this point  Plan: Followup graft scan in 2 months with ABIs

## 2011-04-17 ENCOUNTER — Encounter: Payer: Self-pay | Admitting: Vascular Surgery

## 2011-04-18 ENCOUNTER — Other Ambulatory Visit (INDEPENDENT_AMBULATORY_CARE_PROVIDER_SITE_OTHER): Payer: PRIVATE HEALTH INSURANCE | Admitting: *Deleted

## 2011-04-18 ENCOUNTER — Encounter: Payer: Self-pay | Admitting: Vascular Surgery

## 2011-04-18 ENCOUNTER — Ambulatory Visit (INDEPENDENT_AMBULATORY_CARE_PROVIDER_SITE_OTHER): Payer: PRIVATE HEALTH INSURANCE | Admitting: Vascular Surgery

## 2011-04-18 VITALS — BP 138/89 | HR 103 | Ht 71.0 in | Wt 147.0 lb

## 2011-04-18 DIAGNOSIS — Z48812 Encounter for surgical aftercare following surgery on the circulatory system: Secondary | ICD-10-CM

## 2011-04-18 DIAGNOSIS — I739 Peripheral vascular disease, unspecified: Secondary | ICD-10-CM

## 2011-04-18 DIAGNOSIS — I70219 Atherosclerosis of native arteries of extremities with intermittent claudication, unspecified extremity: Secondary | ICD-10-CM

## 2011-04-18 NOTE — Progress Notes (Signed)
[facility] [place] HISTORY AND PHYSICAL   History of Present Illness:  Patient is a 55 y.o. year old male who presents for follow-up evaluation for PAD.  He recently underwent left to right femoral femoral bypass with right fem above knee pop with vein.  He is on Aspirin for antiplatelet therapy. His atherosclerotic risk factors remain hypertension and smoking.  These are all currently stable and followed by [facility].  The patient currently has no claudication symptoms and is able to walk up to 2 miles.  The patient denies rest pain or ulcers on the feet.  Past Medical History  Diagnosis Date  . Carotid artery occlusion   . Leg pain     with walking  . Foot pain 11/22/2010    while lying flat    Past Surgical History  Procedure Date  . Pr vein bypass graft,aorto-fem-pop 01/07/11    (L)-(R) fem-fem; (R) fem-AK pop BP    Review of Systems:  Neurologic: sensation in the feet is intact Cardiac:denies shortness of breath or chest pain Pulmonary: denies cough or wheeze  Social History History  Substance Use Topics  . Smoking status: Former Smoker -- 0.5 packs/day for 20 years    Types: Cigarettes    Quit date: 02/07/2011  . Smokeless tobacco: Not on file  . Alcohol Use: 1.2 oz/week    2 Cans of beer per week    Allergies  No Known Allergies   Current Outpatient Prescriptions  Medication Sig Dispense Refill  . aspirin 81 MG tablet Take 81 mg by mouth daily.        . metoprolol succinate (TOPROL-XL) 12.5 mg TB24 Take by mouth 2 (two) times daily.          Physical Examination  Filed Vitals:   04/18/11 1536  BP: 138/89  Pulse: 103  Height: 5\' 11"  (1.803 m)  Weight: 147 lb (66.679 kg)  SpO2: 96%    Body mass index is 20.50 kg/(m^2).  General:  Alert and oriented, no acute distress HEENT: Normal Neck: No bruit or JVD Pulmonary: Clear to auscultation bilaterally Cardiac: Regular Rate and Rhythm without murmur Neurologic: Upper and lower  extremity motor 5/5 and symmetric Extremities:  2+ femoral absent pedal pulses Skin: no ulcer or rash  DATA: Patient had a graft duplex exam today which shows no real elevated velocities in his fem-fem or is found above-knee pop bypass graft his ABIs were 0.72 on the right and 0.83 on the left. I reviewed and interpreted this study.   ASSESSMENT:  peripheral arterial disease with patent fem-fem and femoropopliteal bypass graft. He has no claudication symptoms. He will followup in  3 months for the first year and then yearly thereafter  Hypertension we will schedule the patient for an office visit with [facility] the near future for further evaluation of this    Fabienne Bruns, MD [facility] [place] Office: [phone] Pager: [phone] hypertension we have scheduled the patient for an appointment in the near future for further evaluation by [facility].

## 2011-05-03 NOTE — Procedures (Unsigned)
BYPASS GRAFT EVALUATION  INDICATION:  Followup bypass graft placement.  HISTORY: Diabetes:  No Cardiac:  No Hypertension:  Yes Smoking:  Current Previous Surgery:  Left to right femoral to femoral bypass 01/07/2011; right femoral to above knee popliteal bypass with reversed right great saphenous vein 01/07/2011; right popliteal endarterectomy 01/07/2011.  SINGLE LEVEL ARTERIAL EXAM                              RIGHT              LEFT Brachial:                    163                165 Anterior tibial:             119                130 Posterior tibial:            118                137 Peroneal: Ankle/brachial index:        0.72               0.83  PREVIOUS ABI:  Date:  10/24/2010  RIGHT:  0.72  LEFT:  0.83  LOWER EXTREMITY BYPASS GRAFT DUPLEX EXAM:  DUPLEX:  Patent left to right femoral to femoral and right femoral to above knee bypass graft with no evidence of stenosis within the graft.  IMPRESSION: 1. Patent left to right femoral to femoral bypass graft with a     velocity of 236 cm/s noted in the right groin near the anastomosis. 2. Patent right femoral to above knee popliteal bypass graft with no     evidence of stenosis. 3. Right ankle brachial indices are suggestive of moderate arterial     disease. 4. Left ankle brachial indices are suggestive of mild arterial     disease.  ___________________________________________ Janetta Hora. Fields, MD  EM/MEDQ  D:  04/19/2011  T:  04/19/2011  Job:  [id number]

## 2011-07-19 ENCOUNTER — Encounter (INDEPENDENT_AMBULATORY_CARE_PROVIDER_SITE_OTHER): Payer: PRIVATE HEALTH INSURANCE | Admitting: *Deleted

## 2011-07-19 DIAGNOSIS — I739 Peripheral vascular disease, unspecified: Secondary | ICD-10-CM

## 2011-07-19 DIAGNOSIS — Z48812 Encounter for surgical aftercare following surgery on the circulatory system: Secondary | ICD-10-CM

## 2011-08-02 ENCOUNTER — Encounter: Payer: Self-pay | Admitting: Vascular Surgery

## 2011-08-02 ENCOUNTER — Other Ambulatory Visit: Payer: Self-pay | Admitting: *Deleted

## 2011-08-02 DIAGNOSIS — Z48812 Encounter for surgical aftercare following surgery on the circulatory system: Secondary | ICD-10-CM

## 2011-08-02 DIAGNOSIS — I739 Peripheral vascular disease, unspecified: Secondary | ICD-10-CM

## 2011-08-02 NOTE — Procedures (Unsigned)
BYPASS GRAFT EVALUATION  INDICATION:  Followup right fem-pop graft placed 01/07/2011  HISTORY: Diabetes:  No Cardiac:  No Hypertension:  Yes Smoking:  Yes Previous Surgery:  Left to right fem-fem graft, right fem-pop graft, right popliteal endarterectomy all performed 01/07/2011  SINGLE LEVEL ARTERIAL EXAM                              RIGHT              LEFT Brachial: Anterior tibial: Posterior tibial: Peroneal: Ankle/brachial index:        0.99               1.01  PREVIOUS ABI:  Date:  04/18/2011  RIGHT:  0.72  LEFT:  0.83  LOWER EXTREMITY BYPASS GRAFT DUPLEX EXAM:  DUPLEX:  Widely patent right fem-pop graft with elevated velocities observed at the proximal anastomosis which is also the distal anastomosis of the left to right fem-fem graft.  No disease could be visualized; suspect angles of graft flow. There are elevated velocities in the right native popliteal artery just distal to the endarterectomized segment.  Plaque/hyperplasia is observed.  Velocity ratios suggest low end 50%-75% stenosis.  IMPRESSION: 1. Widely patent right femoral-popliteal graft with elevated     velocities at the proximal anastomosis as described above. 2. 50%-75% stenosis in the native right popliteal artery distal to the     endarterectomized segment.  ___________________________________________ Janetta Hora. Fields, MD  LT/MEDQ  D:  07/19/2011  T:  07/19/2011  Job:  [id number]

## 2012-01-29 ENCOUNTER — Encounter (INDEPENDENT_AMBULATORY_CARE_PROVIDER_SITE_OTHER): Payer: Self-pay | Admitting: *Deleted

## 2012-01-29 DIAGNOSIS — I739 Peripheral vascular disease, unspecified: Secondary | ICD-10-CM

## 2012-01-29 DIAGNOSIS — Z48812 Encounter for surgical aftercare following surgery on the circulatory system: Secondary | ICD-10-CM

## 2012-01-30 ENCOUNTER — Other Ambulatory Visit: Payer: Self-pay | Admitting: *Deleted

## 2012-01-30 DIAGNOSIS — I739 Peripheral vascular disease, unspecified: Secondary | ICD-10-CM

## 2012-01-30 DIAGNOSIS — Z48812 Encounter for surgical aftercare following surgery on the circulatory system: Secondary | ICD-10-CM

## 2012-02-04 ENCOUNTER — Encounter: Payer: Self-pay | Admitting: Vascular Surgery

## 2012-07-30 ENCOUNTER — Ambulatory Visit: Payer: Self-pay | Admitting: Neurosurgery

## 2013-09-27 ENCOUNTER — Ambulatory Visit (INDEPENDENT_AMBULATORY_CARE_PROVIDER_SITE_OTHER): Payer: Medicare Other | Admitting: Family Medicine

## 2013-09-27 ENCOUNTER — Encounter: Payer: Self-pay | Admitting: Family Medicine

## 2013-09-27 VITALS — BP 221/98 | HR 114 | Temp 97.8°F | Ht 71.0 in | Wt 163.0 lb

## 2013-09-27 DIAGNOSIS — Z23 Encounter for immunization: Secondary | ICD-10-CM

## 2013-09-27 DIAGNOSIS — Z1211 Encounter for screening for malignant neoplasm of colon: Secondary | ICD-10-CM

## 2013-09-27 DIAGNOSIS — I739 Peripheral vascular disease, unspecified: Secondary | ICD-10-CM

## 2013-09-27 DIAGNOSIS — R7309 Other abnormal glucose: Secondary | ICD-10-CM

## 2013-09-27 DIAGNOSIS — I10 Essential (primary) hypertension: Secondary | ICD-10-CM

## 2013-09-27 DIAGNOSIS — F17200 Nicotine dependence, unspecified, uncomplicated: Secondary | ICD-10-CM

## 2013-09-27 DIAGNOSIS — R739 Hyperglycemia, unspecified: Secondary | ICD-10-CM

## 2013-09-27 LAB — BASIC METABOLIC PANEL
BUN: 6 mg/dL (ref 6–23)
CALCIUM: 9.8 mg/dL (ref 8.4–10.5)
CHLORIDE: 100 meq/L (ref 96–112)
CO2: 29 meq/L (ref 19–32)
CREATININE: 0.7 mg/dL (ref 0.50–1.35)
GLUCOSE: 96 mg/dL (ref 70–99)
Potassium: 4.2 mEq/L (ref 3.5–5.3)
Sodium: 138 mEq/L (ref 135–145)

## 2013-09-27 LAB — LIPID PANEL
CHOLESTEROL: 177 mg/dL (ref 0–200)
HDL: 63 mg/dL (ref >39)
LDL Cholesterol: 99 mg/dL (ref 0–99)
TRIGLYCERIDES: 75 mg/dL (ref <150)
Total CHOL/HDL Ratio: 2.8 Ratio
VLDL: 15 mg/dL (ref 0–40)

## 2013-09-27 LAB — POCT GLYCOSYLATED HEMOGLOBIN (HGB A1C): HEMOGLOBIN A1C: 5.1

## 2013-09-27 MED ORDER — METOPROLOL SUCCINATE ER 25 MG PO TB24: 25.0000 mg | ORAL_TABLET | Freq: Every day | ORAL | Status: DC

## 2013-09-27 MED ORDER — METOPROLOL SUCCINATE ER 25 MG PO TB24
25.0000 mg | ORAL_TABLET | Freq: Every day | ORAL | Status: DC
Start: 2013-09-27 — End: 2013-09-27

## 2013-09-27 MED ORDER — ASPIRIN EC 81 MG PO TBEC: 81.0000 mg | DELAYED_RELEASE_TABLET | Freq: Every day | ORAL | Status: DC

## 2013-09-27 MED ORDER — LISINOPRIL-HYDROCHLOROTHIAZIDE 10-12.5 MG PO TABS: 1.0000 | ORAL_TABLET | Freq: Every day | ORAL | Status: DC

## 2013-09-27 NOTE — Patient Instructions (Addendum)
Thank you for coming in today!  We are checking some labs today, and I will call you if they are abnormal. If you do not hear from me by phone or letter in 2 weeks, please call us as I may have been unable to reach you.   - Make sure you schedule your colonoscopy. I have placed your referral.  - Take lisinopril-HCTZ, metoprolol and aspirin every day.  As you leave, make an appointment to follow up here for blood pressure check in 2 weeks. Also schedule follow up with Dr. Valentina Lucks for pulmonary function testing as you may have COPD.  - Follow up with me in 3 months.   Take care and seek immediate care sooner if you develop any concerns.  Please feel free to call with any questions or concerns at any time, at [phone]. - Dr. Bonner Puna

## 2013-09-27 NOTE — Progress Notes (Signed)
Patient ID: Eric Padilla, male   DOB: [birth year], 58 y.o.   MRN: [id number]   Subjective:  HPI:   Eric Padilla is a 58 y.o. male with a history of HTN, PAD, and ongoing tobacco use here to establish care.  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 He lives alone, walks every day, at least 1 mile, usually 3 miles. He has to stop to rest frequently because of tired feeling muscles in his legs. Denies shortness of breath. He is part of the silver sneakers program, and has been considering going to the [facility] to swim but wants his BP to be fixed first. He gets lightheaded and dizzy when walking and attributes this to high blood pressure.   He has occassional headaches that last 10-15 minutes, for which he takes no medicines. He does take ibuprofen for aches and pains, but not on a regular basis. No shortness of breath, chest pain, abd pain, N/V/D/constipation.   He continues to smoke approximately a third of a pack of cigarettes per day. He says he has used vapors to cut back from 2 packs per day. He has known he needs to quit. He has quit for several (4-5) years in the past but continued to smoke because his wife smoked. They were married for 17 years and divorced more than 10 years ago. He would not like to stop smoking right now.   He is relatively isolated, doesn't speak with any living family members, but does not think he's depressed. He denies helplessness or hopelessness. He has been homeless when first moving to [place] for 1.5 years and was depressed then, but has had housing for years since then. He transports by bus.   He has not taken any medications for the past 3 months.   Review of Systems:  Per HPI. All other systems reviewed and are negative.    Past Medical History: Patient Active Problem List   Diagnosis Date Noted  . Unspecified essential hypertension 09/27/2013  . Hyperglycemia 09/27/2013  . Tobacco use disorder  09/27/2013  . PAD (peripheral artery disease) 04/18/2011   FH: Dad had CABGx4 at age 42 Mom has CVA at 72 Twin brother: hasn't spoken to him in over 51 years Sister younger by 3 years has had breast cancer  Medications: reviewed and updated  Objective:  Physical Exam: BP 221/98  Pulse 114  Temp(Src) 97.8 F (36.6 C) (Oral)  Ht 5\' 11"  (1.803 m)  Wt 163 lb (73.936 kg)  BMI 22.74 kg/m2  Gen: Thin 58 y.o. male in NAD HEENT: MMM, mild rosacea, scattered small telangiectasias  CV: Distant heart sounds, RRR, no MRG Resp: Decreased air movement, non-labored, CTAB, no wheezes noted Abd: Soft, NTND, BS present, no guarding or organomegaly MSK: No edema noted, full ROM Neuro: Alert and oriented, speech normal    Assessment:     Eric Padilla is a 58 y.o. male here to establish care.     Plan:     See problem list for problem-specific plans.

## 2013-09-30 ENCOUNTER — Ambulatory Visit: Payer: Medicare Other | Admitting: Pharmacist

## 2013-09-30 MED ORDER — ATORVASTATIN CALCIUM 40 MG PO TABS: 40.0000 mg | ORAL_TABLET | Freq: Every day | ORAL | Status: DC

## 2013-09-30 NOTE — Assessment & Plan Note (Signed)
Markedly above goal today, so will restart lisinopril-HCTZ and metoprolol today, as HR also markedly elevated. Asymptomatic. Reports no barriers to adherence. Will return for BP check in 2 weeks. Check BMP today.

## 2013-09-30 NOTE — Assessment & Plan Note (Signed)
History of this, check Hb A1c. Denies polyuria, polydipsia, vision changes.

## 2013-09-30 NOTE — Assessment & Plan Note (Signed)
Refer for colonoscopy 

## 2013-09-30 NOTE — Assessment & Plan Note (Signed)
~  10 pack-years by history. Contemplative stage. Urged continued mental preparation for cessation date. Referring to Dr. Valentina Lucks for PFT's to assess for presumed COPD.

## 2013-09-30 NOTE — Assessment & Plan Note (Addendum)
S/p R->L fem-fem bypass, R fem-pop bypass with R popliteal artery endarterectomy in Aug 2012. On follow up Aug 2013 ABI's stable and normal (R: 0.94; L: 0.97) and doppler studies showed patent grafts and >50% stenosis of distal R external iliac a. No longer follows up with surgeon, Dr. Oneida Alar. Refilled ASA and stressed critical importance of smoking cessation. Also starting statin. Could consider cilostazol for symptomatic improvement. Checking Hb A1c, lipid panel, and managing HTN.

## 2013-10-01 ENCOUNTER — Telehealth: Payer: Self-pay | Admitting: *Deleted

## 2013-10-01 HISTORY — PX: COLONOSCOPY: SHX174

## 2013-10-01 NOTE — Telephone Encounter (Signed)
Message copied by Corinna Capra on Fri Oct 01, 2013 12:11 PM ------      Message from: Vance Gather B      Created: Thu Sep 30, 2013  4:45 PM       Will you please call him and tell him that lipitor has been sent to his pharmacy and it's very important that he take this. Also his lab work came back negative. He does not have diabetes and his kidney function looks great. Thank you! ------

## 2013-10-01 NOTE — Telephone Encounter (Signed)
Left message on patient's voicemail.[place]

## 2013-10-05 ENCOUNTER — Encounter: Payer: Self-pay | Admitting: Gastroenterology

## 2013-10-11 ENCOUNTER — Ambulatory Visit (INDEPENDENT_AMBULATORY_CARE_PROVIDER_SITE_OTHER): Payer: Medicare Other | Admitting: *Deleted

## 2013-10-11 ENCOUNTER — Encounter: Payer: Self-pay | Admitting: Pharmacist

## 2013-10-11 ENCOUNTER — Ambulatory Visit (INDEPENDENT_AMBULATORY_CARE_PROVIDER_SITE_OTHER): Payer: Medicare Other | Admitting: Pharmacist

## 2013-10-11 VITALS — BP 151/90 | HR 108 | Ht 71.0 in | Wt 157.6 lb

## 2013-10-11 VITALS — BP 162/80 | HR 90

## 2013-10-11 DIAGNOSIS — F17200 Nicotine dependence, unspecified, uncomplicated: Secondary | ICD-10-CM

## 2013-10-11 DIAGNOSIS — J449 Chronic obstructive pulmonary disease, unspecified: Secondary | ICD-10-CM

## 2013-10-11 DIAGNOSIS — I10 Essential (primary) hypertension: Secondary | ICD-10-CM

## 2013-10-11 NOTE — Patient Instructions (Signed)
Thank you for coming to your appointment today!  Please schedule a follow-up appointment for early June.   Prior to next appointment, try to decrease the amount of tobacco to 2-3 cig/day and consider a possible stop date for smoking.

## 2013-10-11 NOTE — Assessment & Plan Note (Signed)
Spirometry evaluation reveals moderately severe obstruction prior to medications and significant improvement in FVC post-medications.Reviewed results of pulmonary function tests. Pt verbalized understanding of results and education. No drug therapy initiated at this time as patient is preparing for quitting attempt.  Moderate Nicotine Dependence of ~30 years duration in a patient who is good candidate for success b/c of history of prior smoking cessation, readiness to quit, and confidence in ability to quit.  Nicotine replacement therapy (patch and gum) were discussed with the patient to help aid in smoking cessation. At this time, therapy will not be started. Plan for the patient is to decrease smoking to 2-3 cigarettes/day, reconsider nicotine replacement therapy at the next appointment in early June, and determine a quit date.

## 2013-10-11 NOTE — Progress Notes (Signed)
   Pt in for blood pressure check.  Blood pressure today 162/80 manually and heart rate 90.  Pt has appt with Dr. Valentina Lucks.  Will forward to PCP.  Derl Barrow, RN

## 2013-10-11 NOTE — Progress Notes (Signed)
S:    Patient arrives in good spirits and walking without assistance. Presents for lung function evaluation and for evaluation/assistance with tobacco dependence. Patient reports breathing has been good. He reports walking 3 miles without respiratory symptoms.    Age when started using tobacco on a daily basis 18 (started due to his wife smoking). Number of Cigarettes per day 2ppd (now down to 6 per day). Smokes first cigarette after coffee in the morning. (~ 30 minutes after waking) Denies waking to smoke (did awake when he smoked 2ppd). Most recent quit attempt 2013 (following hospitalization for couple of days). Longest time ever been tobacco free 4 years. Rates IMPORTANCE of quitting tobacco on 1-10 scale of 6. Rates READINESS of quitting tobacco on 1-10 scale of 9. Rates CONFIDENCE of quitting tobacco on 1-10 scale of 10. Triggers to use tobacco include with coffee in the morning, beer in the afternoon, and after dinner.   O: See "scanned report" or Documentation Flowsheet (discrete results - PFTs) for  Spirometry results. Patient provided good effort while attempting spirometry.   Lung Age = 89 Albuterol Neb  Lot# [id number]     Exp. 05/2015  BP: first reading - 162/80; second reading 157/90 HR: first reading - 90; second reading 108 (post-albuterol treatment)   A/P: Spirometry evaluation reveals moderately severe obstruction prior to medications and significant improvement in FVC post-medications.Reviewed results of pulmonary function tests. Pt verbalized understanding of results and education. No drug therapy initiated at this time as patient is preparing for quitting attempt.  Moderate Nicotine Dependence of ~30 years duration in a patient who is good candidate for success b/c of history of prior smoking cessation, readiness to quit, and confidence in ability to quit.  Nicotine replacement therapy (patch and gum) were discussed with the patient to help aid in smoking cessation. At  this time, therapy will not be started. Plan for the patient is to decrease smoking to 2-3 cigarettes/day, reconsider nicotine replacement therapy at the next appointment in early June, and determine a quit date.  Patient will continue on current blood pressure medications and follow-up with Dr. Bonner Puna at next appointment.    Reviewed results of pulmonary function tests. Pt verbalized understanding of results and education. Written pt instructions provided. Provided information on 1 800-QUIT NOW support program. F/U Clinic visit early June. Total time in face to face counseling 40 minutes.  Patient seen with Peter Garter, PharmD Candidate, [place] PharmD Resident.

## 2013-10-11 NOTE — Assessment & Plan Note (Signed)
Moderate Nicotine Dependence of ~30 years duration in a patient who is good candidate for success b/c of history of prior smoking cessation, readiness to quit, and confidence in ability to quit.  Nicotine replacement therapy (patch and gum) were discussed with the patient to help aid in smoking cessation. At this time, therapy will not be started. Plan for the patient is to decrease smoking to 2-3 cigarettes/day, reconsider nicotine replacement therapy at the next appointment in early June, and determine a quit date.

## 2013-10-11 NOTE — Progress Notes (Signed)
Patient ID: Eric Padilla, male   DOB: Sep 20, 1955, 58 y.o.   MRN: [id number] Reviewed: Agree with Dr. Graylin Shiver documentation and management.

## 2013-11-23 ENCOUNTER — Ambulatory Visit (AMBULATORY_SURGERY_CENTER): Payer: Self-pay

## 2013-11-23 VITALS — Ht 71.0 in | Wt 156.2 lb

## 2013-11-23 DIAGNOSIS — Z1211 Encounter for screening for malignant neoplasm of colon: Secondary | ICD-10-CM

## 2013-11-23 MED ORDER — NA SULFATE-K SULFATE-MG SULF 17.5-3.13-1.6 GM/177ML PO SOLN: ORAL | Status: DC

## 2013-11-23 NOTE — Progress Notes (Signed)
Per pt, no allergies to soy or egg products.Pt not taking any weight loss meds or using  O2 at home. 

## 2013-11-29 ENCOUNTER — Encounter: Payer: Self-pay | Admitting: Gastroenterology

## 2013-12-07 ENCOUNTER — Encounter: Payer: Self-pay | Admitting: Gastroenterology

## 2013-12-07 ENCOUNTER — Ambulatory Visit (AMBULATORY_SURGERY_CENTER): Payer: Medicare Other | Admitting: Gastroenterology

## 2013-12-07 VITALS — BP 148/80 | HR 83 | Temp 98.5°F | Resp 17 | Ht 71.0 in | Wt 156.0 lb

## 2013-12-07 DIAGNOSIS — K5730 Diverticulosis of large intestine without perforation or abscess without bleeding: Secondary | ICD-10-CM

## 2013-12-07 DIAGNOSIS — Z1211 Encounter for screening for malignant neoplasm of colon: Secondary | ICD-10-CM

## 2013-12-07 DIAGNOSIS — D126 Benign neoplasm of colon, unspecified: Secondary | ICD-10-CM

## 2013-12-07 MED ORDER — SODIUM CHLORIDE 0.9 % IV SOLN: 500.0000 mL | INTRAVENOUS | Status: DC

## 2013-12-07 NOTE — Op Note (Signed)
[place]  [address] [employer] [address] [place], [address]   COLONOSCOPY PROCEDURE REPORT  PATIENT: Teri, Diltz  MR#: [id number] BIRTHDATE: 06-10-1955 , 11  yrs. old GENDER: Male ENDOSCOPIST: Inda Castle, MD REFERRED BY: PROCEDURE DATE:  12/07/2013 PROCEDURE:   Colonoscopy with snare polypectomy and Submucosal injection, any substance First Screening Colonoscopy - Avg.  risk and is 50 yrs.  old or older Yes.  Prior Negative Screening - Now for repeat screening. N/A  History of Adenoma - Now for follow-up colonoscopy & has been > or = to 3 yrs.  N/A  Polyps Removed Today? Yes. ASA CLASS:   Class II INDICATIONS:average risk screening. MEDICATIONS: MAC sedation, administered by CRNA and propofol (Diprivan) 300mg  IV  DESCRIPTION OF PROCEDURE:   After the risks benefits and alternatives of the procedure were thoroughly explained, informed consent was obtained.  A digital rectal exam revealed no abnormalities of the rectum.   The LB CF-H180AL Loaner [id number] endoscope was introduced through the anus and advanced to the cecum, which was identified by both the appendix and ileocecal valve. No adverse events experienced.   The quality of the prep was Suprep good  The instrument was then slowly withdrawn as the colon was fully examined.      COLON FINDINGS: A sessile polyp measuring 3 mm in size was found in the descending colon.  A polypectomy was performed with a cold snare.  The resection was complete and the polyp tissue was completely retrieved.   In the mid sigmoid at approximately 20 cm from the anus there was a semi-pedunculated irregularly-shaped, bleeding polyp measuring approximately 25 mm.  Polyp was removed with hot polypectomy snare and submitted to pathology.  The area was tattooed with "spot.  In the distal sigmoid at approximately 13-14 cm there was a semi-speculated 15 mm polyp.  This was removed with hot polypectomy snare and submitted to pathology.   This area was tattooed as well.   In the mid sigmoid at approximately 20 cm from the anus there was a semi-pedunculated irregularly-shaped, bleeding polyp measuring approximately 25 mm.  Polyp was removed with hot polypectomy snare and submitted to pathology.  The area was tattooed with "spot.  In the distal sigmoid at approximately 13-14 cm there was a semi-speculated 15 mm polyp.  This was removed with hot polypectomy snare and submitted to pathology.  This area was tattooed as well.   Moderate diverticulosis was noted in the sigmoid colon.  Retroflexed views revealed no abnormalities. The time to cecum=5 minutes 38 seconds.  Withdrawal time=18 minutes 38 seconds.  The scope was withdrawn and the procedure completed. COMPLICATIONS: There were no complications.  ENDOSCOPIC IMPRESSION: 1.   colonic polyps 2.  diverticulosis  RECOMMENDATIONS: If the polyp(s) removed today are proven to be adenomatous (pre-cancerous) polyps, you will need a colonoscopy in 1 year. You will receive a letter within 1-2 weeks with the results of your biopsy as well as final recommendations.  Please call my office if you have not received a letter after 3 weeks.   eSigned:  Inda Castle, MD 12/07/2013 11:34 AM   cc: Vance Gather, MD   PATIENT NAME:  Chaske, Paskett MR#: [id number]

## 2013-12-07 NOTE — Progress Notes (Signed)
Report to PACU, RN, vss, BBS= Clear.  

## 2013-12-07 NOTE — Progress Notes (Signed)
Pt. s  Clothing and belongs to [employer].

## 2013-12-07 NOTE — Patient Instructions (Addendum)
No aspirin or anti-inflammatory medicines for 2 weeks  YOU HAD AN ENDOSCOPIC PROCEDURE TODAY AT [facility]: Refer to the procedure report that was given to you for any specific questions about what was found during the examination.  If the procedure report does not answer your questions, please call your gastroenterologist to clarify.  If you requested that your care partner not be given the details of your procedure findings, then the procedure report has been included in a sealed envelope for you to review at your convenience later.  YOU SHOULD EXPECT: Some feelings of bloating in the abdomen. Passage of more gas than usual.  Walking can help get rid of the air that was put into your GI tract during the procedure and reduce the bloating. If you had a lower endoscopy (such as a colonoscopy or flexible sigmoidoscopy) you may notice spotting of blood in your stool or on the toilet paper. If you underwent a bowel prep for your procedure, then you may not have a normal bowel movement for a few days.  DIET: Your first meal following the procedure should be a light meal and then it is ok to progress to your normal diet.  A half-sandwich or bowl of soup is an example of a good first meal.  Heavy or fried foods are harder to digest and may make you feel nauseous or bloated.  Likewise meals heavy in dairy and vegetables can cause extra gas to form and this can also increase the bloating.  Drink plenty of fluids but you should avoid alcoholic beverages for 24 hours.  ACTIVITY: Your care partner should take you home directly after the procedure.  You should plan to take it easy, moving slowly for the rest of the day.  You can resume normal activity the day after the procedure however you should NOT DRIVE or use heavy machinery for 24 hours (because of the sedation medicines used during the test).    SYMPTOMS TO REPORT IMMEDIATELY: A gastroenterologist can be reached at any hour.  During normal  business hours, 8:30 AM to 5:00 PM Monday through Friday, call [phone].  After hours and on weekends, please call the GI answering service at [phone] who will take a message and have the physician on call contact you.   Following lower endoscopy (colonoscopy or flexible sigmoidoscopy):  Excessive amounts of blood in the stool  Significant tenderness or worsening of abdominal pains  Swelling of the abdomen that is new, acute  Fever of 100F or higher   FOLLOW UP: If any biopsies were taken you will be contacted by phone or by letter within the next 1-3 weeks.  Call your gastroenterologist if you have not heard about the biopsies in 3 weeks.  Our staff will call the home number listed on your records the next business day following your procedure to check on you and address any questions or concerns that you may have at that time regarding the information given to you following your procedure. This is a courtesy call and so if there is no answer at the home number and we have not heard from you through the emergency physician on call, we will assume that you have returned to your regular daily activities without incident.  SIGNATURES/CONFIDENTIALITY: You and/or your care partner have signed paperwork which will be entered into your electronic medical record.  These signatures attest to the fact that that the information above on your After Visit Summary has been reviewed and  is understood.  Full responsibility of the confidentiality of this discharge information lies with you and/or your care-partner.   Information given on polyps, diverticulosis and high fiber diet with discharge instructions. Resume all other medications except aspirin products.

## 2013-12-08 ENCOUNTER — Telehealth: Payer: Self-pay | Admitting: *Deleted

## 2013-12-08 NOTE — Telephone Encounter (Signed)
  Follow up Call-  Call back number 12/07/2013  Post procedure Call Back phone  # [phone]  Permission to leave phone message Yes     Patient questions:  Do you have a fever, pain , or abdominal swelling? No. Pain Score  0 *  Have you tolerated food without any problems? Yes.    Have you been able to return to your normal activities? Yes.    Do you have any questions about your discharge instructions: Diet   No. Medications  No. Follow up visit  No.  Do you have questions or concerns about your Care? No.  Actions: * If pain score is 4 or above: No action needed, pain <4.

## 2013-12-10 ENCOUNTER — Ambulatory Visit (INDEPENDENT_AMBULATORY_CARE_PROVIDER_SITE_OTHER): Payer: Medicare Other | Admitting: Family Medicine

## 2013-12-10 ENCOUNTER — Encounter: Payer: Self-pay | Admitting: Family Medicine

## 2013-12-10 VITALS — BP 151/73 | HR 102 | Temp 98.0°F | Ht 71.0 in | Wt 155.0 lb

## 2013-12-10 DIAGNOSIS — Z5181 Encounter for therapeutic drug level monitoring: Secondary | ICD-10-CM

## 2013-12-10 DIAGNOSIS — I10 Essential (primary) hypertension: Secondary | ICD-10-CM

## 2013-12-10 DIAGNOSIS — F17200 Nicotine dependence, unspecified, uncomplicated: Secondary | ICD-10-CM

## 2013-12-10 MED ORDER — METOPROLOL SUCCINATE ER 50 MG PO TB24: 50.0000 mg | ORAL_TABLET | Freq: Every day | ORAL | Status: DC

## 2013-12-10 MED ORDER — LISINOPRIL-HYDROCHLOROTHIAZIDE 20-25 MG PO TABS: 1.0000 | ORAL_TABLET | Freq: Every day | ORAL | Status: DC

## 2013-12-10 NOTE — Progress Notes (Signed)
Patient ID: Eric Padilla, male   DOB: 06/27/1955, 58 y.o.   MRN: [id number]   Subjective:  HPI:   Eric Padilla is a 58 y.o. male with a history of HTN, PAD, smoking here for follow up of blood pressure.   Eric Padilla has no concerns today, reports taking medications without noticed side effects. He can afford medications and is 100% compliant. Denies HA, dizziness, syncope, vision changes, palpitations, COB, CP. He is still smoking about 6 cigarettes per day and walks every day. His recent PFTs convinced him of the need for him to quit smoking, but wants to get down to about 2 cigarettes per day prior to beginning NRT and setting a stop date. Does not check BP at home.   Review of Systems:  Per HPI. All other systems reviewed and are negative.    Past Medical History: Patient Active Problem List   Diagnosis Date Noted  . COPD, moderate 10/11/2013  . Unspecified essential hypertension 09/27/2013  . Hyperglycemia 09/27/2013  . Tobacco use disorder 09/27/2013  . Special screening for malignant neoplasms, colon 09/27/2013  . PAD (peripheral artery disease) 04/18/2011    Medications: reviewed and updated Current Outpatient Prescriptions  Medication Sig Dispense Refill  . aspirin EC 81 MG tablet Take 1 tablet (81 mg total) by mouth daily.  90 tablet  3  . atorvastatin (LIPITOR) 40 MG tablet Take 1 tablet (40 mg total) by mouth daily.  30 tablet  11  . lisinopril-hydrochlorothiazide (PRINZIDE,ZESTORETIC) 20-25 MG per tablet Take 1 tablet by mouth daily.  90 tablet  3  . metoprolol succinate (TOPROL-XL) 50 MG 24 hr tablet Take 1 tablet (50 mg total) by mouth daily. Take with or immediately following a meal.  90 tablet  3   No current facility-administered medications for this visit.    Objective:  Physical Exam: BP 151/73  Pulse 102  Temp(Src) 98 F (36.7 C) (Oral)  Ht 5\' 11"  (1.803 m)  Wt 155 lb (70.308 kg)  BMI 21.63 kg/m2  Gen: Thin 58 y.o. male in NAD HEENT: MMM, EOMI, PERRL,  anicteric sclerae CV: RRR, no MRG, no JVD Resp: Non-labored, CTAB, no wheezes noted Abd: Soft, NTND, BS present, no guarding or organomegaly MSK: No edema noted, full ROM Skin: photoaging evident  Neuro: Alert and oriented, speech normal      Chemistry      Component Value Date/Time   NA 138 09/27/2013 1505   K 4.2 09/27/2013 1505   CL 100 09/27/2013 1505   CO2 29 09/27/2013 1505   BUN 6 09/27/2013 1505   CREATININE 0.70 09/27/2013 1505   CREATININE <0.47* 01/08/2011 0420      Component Value Date/Time   CALCIUM 9.8 09/27/2013 1505   ALKPHOS 111 01/01/2011 1353   AST 16 01/01/2011 1353   ALT 17 01/01/2011 1353   BILITOT 0.3 01/01/2011 1353      Lab Results  Component Value Date   WBC 7.7 01/11/2011   HGB 10.2* 01/11/2011   HCT 29.2* 01/11/2011   MCV 93.6 01/11/2011   PLT 278 01/11/2011   Lab Results  Component Value Date   HGBA1C 5.1 09/27/2013   Assessment:     Eric Padilla is a 58 y.o. male here for HTN follow up.     Plan:     See problem list for problem-specific plans.

## 2013-12-10 NOTE — Patient Instructions (Signed)
Thank you for coming in today!  - Lisinopril-HCTZ take 2 of what you have (for double the dose) until you're out, then start taking the new prescription (20-25) Metoprolol take 2 of what ywhat you have until you're out, then take the new prescription (50mg ) Good job on cutting down smoking Come back in 2 weeks to check lood pressure and some labs.    Take care and seek immediate care sooner if you develop any concerns.  Please feel free to call with any questions or concerns at any time, at [phone]. - Dr. Bonner Puna

## 2013-12-10 NOTE — Assessment & Plan Note (Signed)
Continues to attempt cutting back with additional motivation from recent RFT results. Wishes to discuss NRT and setting quit date at follow up.

## 2013-12-10 NOTE — Assessment & Plan Note (Signed)
Remains above goal, but improving. Increase lisinopril-HCTZ and metoprolol (HR elevated). Recheck in 1 month.

## 2013-12-15 ENCOUNTER — Encounter: Payer: Self-pay | Admitting: Gastroenterology

## 2013-12-24 ENCOUNTER — Other Ambulatory Visit: Payer: Medicare Other

## 2013-12-24 ENCOUNTER — Ambulatory Visit (INDEPENDENT_AMBULATORY_CARE_PROVIDER_SITE_OTHER): Payer: Medicare Other | Admitting: *Deleted

## 2013-12-24 VITALS — BP 140/70

## 2013-12-24 DIAGNOSIS — Z136 Encounter for screening for cardiovascular disorders: Secondary | ICD-10-CM

## 2013-12-24 DIAGNOSIS — Z0130 Encounter for examination of blood pressure without abnormal findings: Secondary | ICD-10-CM

## 2013-12-24 DIAGNOSIS — Z5181 Encounter for therapeutic drug level monitoring: Secondary | ICD-10-CM

## 2013-12-24 DIAGNOSIS — I10 Essential (primary) hypertension: Secondary | ICD-10-CM

## 2013-12-24 NOTE — Progress Notes (Signed)
Drew BMP Unk Lightning, MLS

## 2013-12-24 NOTE — Progress Notes (Signed)
   Pt in nurse clinic blood pressure check.  BP 140/70 manually.  Pt denies any symptoms today.  Pt is taking medication as prescribed. Derl Barrow, RN

## 2013-12-25 LAB — BASIC METABOLIC PANEL
BUN: 15 mg/dL (ref 6–23)
CALCIUM: 9.9 mg/dL (ref 8.4–10.5)
CHLORIDE: 90 meq/L — AB (ref 96–112)
CO2: 27 mEq/L (ref 19–32)
Creat: 1.05 mg/dL (ref 0.50–1.35)
Glucose, Bld: 105 mg/dL — ABNORMAL HIGH (ref 70–99)
Potassium: 5.5 mEq/L — ABNORMAL HIGH (ref 3.5–5.3)
Sodium: 124 mEq/L — ABNORMAL LOW (ref 135–145)

## 2014-01-19 ENCOUNTER — Other Ambulatory Visit: Payer: Self-pay | Admitting: *Deleted

## 2014-01-19 DIAGNOSIS — Z48812 Encounter for surgical aftercare following surgery on the circulatory system: Secondary | ICD-10-CM

## 2014-01-19 DIAGNOSIS — I739 Peripheral vascular disease, unspecified: Secondary | ICD-10-CM

## 2014-02-08 ENCOUNTER — Encounter: Payer: Self-pay | Admitting: Family

## 2014-02-09 ENCOUNTER — Ambulatory Visit (INDEPENDENT_AMBULATORY_CARE_PROVIDER_SITE_OTHER)
Admission: RE | Admit: 2014-02-09 | Discharge: 2014-02-09 | Disposition: A | Discharge status: Home / Self Care | Payer: Medicare Other | Source: Ambulatory Visit | Attending: Family | Admitting: Family

## 2014-02-09 ENCOUNTER — Ambulatory Visit (INDEPENDENT_AMBULATORY_CARE_PROVIDER_SITE_OTHER): Payer: Medicare Other | Admitting: Family

## 2014-02-09 ENCOUNTER — Ambulatory Visit (HOSPITAL_COMMUNITY)
Admission: RE | Admit: 2014-02-09 | Discharge: 2014-02-09 | Disposition: A | Discharge status: Home / Self Care | Payer: Medicare Other | Source: Ambulatory Visit | Attending: Family | Admitting: Family

## 2014-02-09 ENCOUNTER — Encounter: Payer: Self-pay | Admitting: Family

## 2014-02-09 VITALS — BP 144/95 | HR 84 | Resp 16 | Ht 71.0 in | Wt 149.0 lb

## 2014-02-09 DIAGNOSIS — I739 Peripheral vascular disease, unspecified: Secondary | ICD-10-CM

## 2014-02-09 DIAGNOSIS — Z9889 Other specified postprocedural states: Secondary | ICD-10-CM | POA: Diagnosis not present

## 2014-02-09 DIAGNOSIS — I743 Embolism and thrombosis of arteries of the lower extremities: Secondary | ICD-10-CM | POA: Diagnosis not present

## 2014-02-09 DIAGNOSIS — Z48812 Encounter for surgical aftercare following surgery on the circulatory system: Secondary | ICD-10-CM

## 2014-02-09 DIAGNOSIS — R9439 Abnormal result of other cardiovascular function study: Secondary | ICD-10-CM | POA: Diagnosis not present

## 2014-02-09 DIAGNOSIS — M79609 Pain in unspecified limb: Secondary | ICD-10-CM

## 2014-02-09 DIAGNOSIS — E785 Hyperlipidemia, unspecified: Secondary | ICD-10-CM | POA: Diagnosis not present

## 2014-02-09 DIAGNOSIS — I10 Essential (primary) hypertension: Secondary | ICD-10-CM | POA: Insufficient documentation

## 2014-02-09 DIAGNOSIS — I70219 Atherosclerosis of native arteries of extremities with intermittent claudication, unspecified extremity: Secondary | ICD-10-CM

## 2014-02-09 DIAGNOSIS — M79662 Pain in left lower leg: Secondary | ICD-10-CM | POA: Insufficient documentation

## 2014-02-09 NOTE — Progress Notes (Signed)
[facility] HISTORY AND PHYSICAL -PAD  History of Present Illness Eric Padilla is a 58 y.o. male patient of Dr. Oneida Alar who presents for follow-up evaluation for PAD. He is s/p right to left femoral-femoral bypass graft, right lower extremity femoral-popliteal artery bypass graft with vein and right popliteal endarterectomy performed 01/07/2011.  He is on Aspirin for antiplatelet therapy. His atherosclerotic risk factors remain hypertension and smoking. These are all currently stable and followed by his PCP. He was able to walk 3 miles with no claudication up until a couple months ago when he started having left calf cramps after walking a mile, resolves with rest and he continues to walk a total of 3 miles daily. He denies non healing wounds. He denies any history of stroke or TIA.  The patient reports New Medical or Surgical History: colonoscopy a couple of months ago.  Pt Diabetic: No Pt smoker: smoker  (4 cigarettes/day, started smoking in his early 20's, was up to 2 ppd)  Pt meds include: Statin :Yes Betablocker: Yes ASA: Yes Other anticoagulants/antiplatelets: no  Past Medical History  Diagnosis Date  . Carotid artery occlusion   . Leg pain     with walking  . Foot pain 11/22/2010    while lying flat  . Hypertension   . Hyperlipidemia   . Peripheral arterial disease     Social History History  Substance Use Topics  . Smoking status: Light Tobacco Smoker -- 0.50 packs/day for 20 years    Types: Cigarettes  . Smokeless tobacco: Never Used  . Alcohol Use: 6.0 - 7.0 oz/week    12-14 drink(s) per week    Family History Family History  Problem Relation Age of Onset  . Stroke Mother   . Heart disease Father   . Breast cancer Sister     Past Surgical History  Procedure Laterality Date  . Pr vein bypass graft,aorto-fem-pop  01/07/11    (L)-(R) fem-fem; (R) fem-AK pop BP  . Colonoscopy  May 2015    No Known Allergies  Current Outpatient  Prescriptions  Medication Sig Dispense Refill  . aspirin EC 81 MG tablet Take 1 tablet (81 mg total) by mouth daily.  90 tablet  3  . atorvastatin (LIPITOR) 40 MG tablet Take 1 tablet (40 mg total) by mouth daily.  30 tablet  11  . lisinopril-hydrochlorothiazide (PRINZIDE,ZESTORETIC) 20-25 MG per tablet Take 1 tablet by mouth daily.  90 tablet  3  . metoprolol succinate (TOPROL-XL) 50 MG 24 hr tablet Take 1 tablet (50 mg total) by mouth daily. Take with or immediately following a meal.  90 tablet  3   No current facility-administered medications for this visit.    ROS: See HPI for pertinent positives and negatives.   Physical Examination  Filed Vitals:   02/09/14 1458  BP: 144/95  Pulse: 84  Resp: 16  Height: 5\' 11"  (1.803 m)  Weight: 149 lb (67.586 kg)  SpO2: 98%   Body mass index is 20.79 kg/(m^2).  General: A&O x 3, WDWN. Gait: normal Eyes: PERRLA. Pulmonary: CTAB, without wheezes , rales or rhonchi. Cardiac: regular Rythm , without detected murmur.         Carotid Bruits Right Left   Negative Negative  Aorta is not palpable. Radial pulses: are 2+ palpable and =                           VASCULAR EXAM: Extremities  without ischemic changes  without Gangrene; without open wounds.                                                                                                          LE Pulses Right Left       FEMORAL   palpable   palpable        POPLITEAL  not palpable   not palpable       POSTERIOR TIBIAL  not palpable, triphasic waveforms by Doppler   not palpable, monophasic waveforms by Doppler        DORSALIS PEDIS      ANTERIOR TIBIAL not palpable,  triphasic waveforms by Doppler  not palpable, monophasic waveforms by Doppler    Abdomen: soft, NT, no masses. Skin: no rashes, no ulcers noted, purpuric areas on forearms. Musculoskeletal: no muscle wasting or atrophy.  Neurologic: A&O X 3; Appropriate Affect ; SENSATION: normal; MOTOR FUNCTION:  moving  all extremities equally, motor strength 5/5 throughout. Speech is fluent/normal. CN 2-12 intact.    Non-Invasive Vascular Imaging: DATE: 02/09/2014 LOWER EXTREMITY ARTERIAL DUPLEX EVALUATION    INDICATION: Peripheral vascular disease     PREVIOUS INTERVENTION(S): Right to left femoral-femoral bypass graft, right lower extremity femoral-popliteal artery bypass graft and right popliteal endarterectomy performed 01/07/2011.    DUPLEX EXAM:     RIGHT  LEFT   Peak Systolic Velocity (cm/s) Ratio (if abnormal) Waveform  Peak Systolic Velocity (cm/s) Ratio (if abnormal) Waveform  181  T Inflow Artery     110  T Proximal Anastomosis     50  B Proximal Graft     52  B Mid Graft     39  B  Distal Graft     53  B Distal Anastomosis     44/86/38 1.95 B/B/B Outflow Artery     0.84/0.56 Today's ABI / TBI 0.68/0.28  0.94 Previous ABI / TBI (01/29/2012 ) 0.97    Waveform:    M - Monophasic       B - Biphasic       T - Triphasic  If Ankle Brachial Index (ABI) or Toe Brachial Index (TBI) performed, please see complete report  ADDITIONAL FINDINGS:     IMPRESSION: Unable to visualize the right distal external iliac artery due to acoustic shadow and history of surgical intervention. Patent right femoral-popliteal artery bypass graft. Patent artery outflow with heterogeneous plaque present involving the right tibioperoneal trunk suggestive of stenosis nearing 50%.      Compared to the previous exam:  Minimal decrease in right ankle brachial index as well as significant decrease of the left since previous study on 01/29/2012. Unable to evaluate the right external iliac artery on today's exam due to acoustic shadowing and surgical intervention.    ASSESSMENT: Eric Padilla is a 58 y.o. male who is s/p right to left femoral-femoral bypass graft, right lower extremity femoral-popliteal artery bypass graft with vein and right popliteal endarterectomy performed 01/07/2011. Right LE arterial Duplex  today: Unable to visualize the right distal external iliac artery  due to acoustic shadow and history of surgical intervention. Patent right femoral-popliteal artery bypass graft. Patent artery outflow with heterogeneous plaque present involving the right tibioperoneal trunk suggestive of stenosis nearing 50%.  Minimal decrease in right ankle brachial index as well as significant decrease of the left since previous study on 01/29/2012. Unable to evaluate the right external iliac artery on today's exam due to acoustic shadowing and surgical intervention. His left calf claudication corresponds to significantly decreased left ABI. Unfortunately he continues to smoke but has decreased the amount significantly.   PLAN:  He was counseled re smoking cessation. Continue graduated walking program. I discussed in depth with the patient the nature of atherosclerosis, and emphasized the importance of maximal medical management including strict control of blood pressure, blood glucose, and lipid levels, obtaining regular exercise, and cessation of smoking.  The patient is aware that without maximal medical management the underlying atherosclerotic disease process will progress, limiting the benefit of any interventions.  Based on the patient's vascular studies and examination, pt will return to clinic in 6 months for ABI's and bilateral LE arterial Duplex.  The patient was given information about PAD including signs, symptoms, treatment, what symptoms should prompt the patient to seek immediate medical care, and risk reduction measures to take.  Clemon Chambers, RN, MSN, FNP-[facility] Phone: [phone]  Clinic MD: Scot Dock  02/09/2014 3:33 PM

## 2014-02-09 NOTE — Patient Instructions (Addendum)
Peripheral Vascular Disease Peripheral Vascular Disease (PVD), also called Peripheral Arterial Disease (PAD), is a circulation problem caused by cholesterol (atherosclerotic plaque) deposits in the arteries. PVD commonly occurs in the lower extremities (legs) but it can occur in other areas of the body, such as your arms. The cholesterol buildup in the arteries reduces blood flow which can cause pain and other serious problems. The presence of PVD can place a person at risk for Coronary Artery Disease (CAD).  CAUSES  Causes of PVD can be many. It is usually associated with more than one risk factor such as:   High Cholesterol.  Smoking.  Diabetes.  Lack of exercise or inactivity.  High blood pressure (hypertension).  Obesity.  Family history. SYMPTOMS   When the lower extremities are affected, patients with PVD may experience:  Leg pain with exertion or physical activity. This is called INTERMITTENT CLAUDICATION. This may present as cramping or numbness with physical activity. The location of the pain is associated with the level of blockage. For example, blockage at the abdominal level (distal abdominal aorta) may result in buttock or hip pain. Lower leg arterial blockage may result in calf pain.  As PVD becomes more severe, pain can develop with less physical activity.  In people with severe PVD, leg pain may occur at rest.  Other PVD signs and symptoms:  Leg numbness or weakness.  Coldness in the affected leg or foot, especially when compared to the other leg.  A change in leg color.  Patients with significant PVD are more prone to ulcers or sores on toes, feet or legs. These may take longer to heal or may reoccur. The ulcers or sores can become infected.  If signs and symptoms of PVD are ignored, gangrene may occur. This can result in the loss of toes or loss of an entire limb.  Not all leg pain is related to PVD. Other medical conditions can cause leg pain such  as:  Blood clots (embolism) or Deep Vein Thrombosis.  Inflammation of the blood vessels (vasculitis).  Spinal stenosis. DIAGNOSIS  Diagnosis of PVD can involve several different types of tests. These can include:  Pulse Volume Recording Method (PVR). This test is simple, painless and does not involve the use of X-rays. PVR involves measuring and comparing the blood pressure in the arms and legs. An ABI (Ankle-Brachial Index) is calculated. The normal ratio of blood pressures is 1. As this number becomes smaller, it indicates more severe disease.  < 0.95 - indicates significant narrowing in one or more leg vessels.  <0.8 - there will usually be pain in the foot, leg or buttock with exercise.  <0.4 - will usually have pain in the legs at rest.  <0.25 - usually indicates limb threatening PVD.  Doppler detection of pulses in the legs. This test is painless and checks to see if you have a pulses in your legs/feet.  A dye or contrast material (a substance that highlights the blood vessels so they show up on x-ray) may be given to help your caregiver better see the arteries for the following tests. The dye is eliminated from your body by the kidney's. Your caregiver may order blood work to check your kidney function and other laboratory values before the following tests are performed:  Magnetic Resonance Angiography (MRA). An MRA is a picture study of the blood vessels and arteries. The MRA machine uses a large magnet to produce images of the blood vessels.  Computed Tomography Angiography (CTA). A CTA   is a specialized x-ray that looks at how the blood flows in your blood vessels. An IV may be inserted into your arm so contrast dye can be injected.  Angiogram. Is a procedure that uses x-rays to look at your blood vessels. This procedure is minimally invasive, meaning a small incision (cut) is made in your groin. A small tube (catheter) is then inserted into the artery of your groin. The catheter  is guided to the blood vessel or artery your caregiver wants to examine. Contrast dye is injected into the catheter. X-rays are then taken of the blood vessel or artery. After the images are obtained, the catheter is taken out. TREATMENT  Treatment of PVD involves many interventions which may include:  Lifestyle changes:  Quitting smoking.  Exercise.  Following a low fat, low cholesterol diet.  Control of diabetes.  Foot care is very important to the PVD patient. Good foot care can help prevent infection.  Medication:  Cholesterol-lowering medicine.  Blood pressure medicine.  Anti-platelet drugs.  Certain medicines may reduce symptoms of Intermittent Claudication.  Interventional/Surgical options:  Angioplasty. An Angioplasty is a procedure that inflates a balloon in the blocked artery. This opens the blocked artery to improve blood flow.  Stent Implant. A wire mesh tube (stent) is placed in the artery. The stent expands and stays in place, allowing the artery to remain open.  Peripheral Bypass Surgery. This is a surgical procedure that reroutes the blood around a blocked artery to help improve blood flow. This type of procedure may be performed if Angioplasty or stent implants are not an option. SEEK IMMEDIATE MEDICAL CARE IF:   You develop pain or numbness in your arms or legs.  Your arm or leg turns cold, becomes blue in color.  You develop redness, warmth, swelling and pain in your arms or legs. MAKE SURE YOU:   Understand these instructions.  Will watch your condition.  Will get help right away if you are not doing well or get worse. Document Released: 06/27/2004 Document Revised: 08/12/2011 Document Reviewed: 05/24/2008 ExitCare Patient Information 2015 [employer]. This information is not intended to replace advice given to you by your health care provider. Make sure you discuss any questions you have with your health care provider.   Smoking  Cessation Quitting smoking is important to your health and has many advantages. However, it is not always easy to quit since nicotine is a very addictive drug. Oftentimes, people try 3 times or more before being able to quit. This document explains the best ways for you to prepare to quit smoking. Quitting takes hard work and a lot of effort, but you can do it. ADVANTAGES OF QUITTING SMOKING  You will live longer, feel better, and live better.  Your body will feel the impact of quitting smoking almost immediately.  Within 20 minutes, blood pressure decreases. Your pulse returns to its normal level.  After 8 hours, carbon monoxide levels in the blood return to normal. Your oxygen level increases.  After 24 hours, the chance of having a heart attack starts to decrease. Your breath, hair, and body stop smelling like smoke.  After 48 hours, damaged nerve endings begin to recover. Your sense of taste and smell improve.  After 72 hours, the body is virtually free of nicotine. Your bronchial tubes relax and breathing becomes easier.  After 2 to 12 weeks, lungs can hold more air. Exercise becomes easier and circulation improves.  The risk of having a heart attack, stroke, cancer,   or lung disease is greatly reduced.  After 1 year, the risk of coronary heart disease is cut in half.  After 5 years, the risk of stroke falls to the same as a nonsmoker.  After 10 years, the risk of lung cancer is cut in half and the risk of other cancers decreases significantly.  After 15 years, the risk of coronary heart disease drops, usually to the level of a nonsmoker.  If you are pregnant, quitting smoking will improve your chances of having a healthy baby.  The people you live with, especially any children, will be healthier.  You will have extra money to spend on things other than cigarettes. QUESTIONS TO THINK ABOUT BEFORE ATTEMPTING TO QUIT You may want to talk about your answers with your health care  provider.  Why do you want to quit?  If you tried to quit in the past, what helped and what did not?  What will be the most difficult situations for you after you quit? How will you plan to handle them?  Who can help you through the tough times? Your family? Friends? A health care provider?  What pleasures do you get from smoking? What ways can you still get pleasure if you quit? Here are some questions to ask your health care provider:  How can you help me to be successful at quitting?  What medicine do you think would be best for me and how should I take it?  What should I do if I need more help?  What is smoking withdrawal like? How can I get information on withdrawal? GET READY  Set a quit date.  Change your environment by getting rid of all cigarettes, ashtrays, matches, and lighters in your home, car, or work. Do not let people smoke in your home.  Review your past attempts to quit. Think about what worked and what did not. GET SUPPORT AND ENCOURAGEMENT You have a better chance of being successful if you have help. You can get support in many ways.  Tell your family, friends, and coworkers that you are going to quit and need their support. Ask them not to smoke around you.  Get individual, group, or telephone counseling and support. Programs are available at local hospitals and health centers. Call your local health department for information about programs in your area.  Spiritual beliefs and practices may help some smokers quit.  Download a "quit meter" on your computer to keep track of quit statistics, such as how long you have gone without smoking, cigarettes not smoked, and money saved.  Get a self-help book about quitting smoking and staying off tobacco. LEARN NEW SKILLS AND BEHAVIORS  Distract yourself from urges to smoke. Talk to someone, go for a walk, or occupy your time with a task.  Change your normal routine. Take a different route to work. Drink tea  instead of coffee. Eat breakfast in a different place.  Reduce your stress. Take a hot bath, exercise, or read a book.  Plan something enjoyable to do every day. Reward yourself for not smoking.  Explore interactive web-based programs that specialize in helping you quit. GET MEDICINE AND USE IT CORRECTLY Medicines can help you stop smoking and decrease the urge to smoke. Combining medicine with the above behavioral methods and support can greatly increase your chances of successfully quitting smoking.  Nicotine replacement therapy helps deliver nicotine to your body without the negative effects and risks of smoking. Nicotine replacement therapy includes nicotine gum, lozenges, inhalers,   nasal sprays, and skin patches. Some may be available over-the-counter and others require a prescription.  Antidepressant medicine helps people abstain from smoking, but how this works is unknown. This medicine is available by prescription.  Nicotinic receptor partial agonist medicine simulates the effect of nicotine in your brain. This medicine is available by prescription. Ask your health care provider for advice about which medicines to use and how to use them based on your health history. Your health care provider will tell you what side effects to look out for if you choose to be on a medicine or therapy. Carefully read the information on the package. Do not use any other product containing nicotine while using a nicotine replacement product.  RELAPSE OR DIFFICULT SITUATIONS Most relapses occur within the first 3 months after quitting. Do not be discouraged if you start smoking again. Remember, most people try several times before finally quitting. You may have symptoms of withdrawal because your body is used to nicotine. You may crave cigarettes, be irritable, feel very hungry, cough often, get headaches, or have difficulty concentrating. The withdrawal symptoms are only temporary. They are strongest when you  first quit, but they will go away within 10-14 days. To reduce the chances of relapse, try to:  Avoid drinking alcohol. Drinking lowers your chances of successfully quitting.  Reduce the amount of caffeine you consume. Once you quit smoking, the amount of caffeine in your body increases and can give you symptoms, such as a rapid heartbeat, sweating, and anxiety.  Avoid smokers because they can make you want to smoke.  Do not let weight gain distract you. Many smokers will gain weight when they quit, usually less than 10 pounds. Eat a healthy diet and stay active. You can always lose the weight gained after you quit.  Find ways to improve your mood other than smoking. FOR MORE INFORMATION  www.smokefree.gov  Document Released: 05/14/2001 Document Revised: 10/04/2013 Document Reviewed: 08/29/2011 ExitCare Patient Information 2015 [employer]. This information is not intended to replace advice given to you by your health care provider. Make sure you discuss any questions you have with your health care provider.  

## 2014-02-09 NOTE — Addendum Note (Signed)
Addended by: Mena Goes on: 02/09/2014 04:11 PM   Modules accepted: Orders

## 2014-03-10 ENCOUNTER — Encounter: Payer: Self-pay | Admitting: Family Medicine

## 2014-03-11 NOTE — Addendum Note (Signed)
Addended by: Leavy Cella on: 03/11/2014 09:14 AM   Modules accepted: Orders

## 2014-03-16 ENCOUNTER — Ambulatory Visit (INDEPENDENT_AMBULATORY_CARE_PROVIDER_SITE_OTHER): Payer: Medicare Other | Admitting: *Deleted

## 2014-03-16 DIAGNOSIS — Z23 Encounter for immunization: Secondary | ICD-10-CM

## 2014-04-13 ENCOUNTER — Other Ambulatory Visit: Payer: Self-pay | Admitting: *Deleted

## 2014-04-13 DIAGNOSIS — I739 Peripheral vascular disease, unspecified: Secondary | ICD-10-CM

## 2014-08-11 ENCOUNTER — Other Ambulatory Visit (HOSPITAL_COMMUNITY): Payer: Medicare Other

## 2014-08-11 ENCOUNTER — Ambulatory Visit: Payer: Medicare Other | Admitting: Family

## 2014-08-11 ENCOUNTER — Encounter (HOSPITAL_COMMUNITY): Payer: Medicare Other

## 2014-08-18 ENCOUNTER — Ambulatory Visit: Payer: Medicare Other | Admitting: Family

## 2014-08-18 ENCOUNTER — Encounter (HOSPITAL_COMMUNITY): Payer: Medicare Other

## 2014-08-18 ENCOUNTER — Other Ambulatory Visit (HOSPITAL_COMMUNITY): Payer: Medicare Other

## 2014-08-18 ENCOUNTER — Inpatient Hospital Stay (HOSPITAL_COMMUNITY)
Admission: RE | Admit: 2014-08-18 | Discharge: 2014-08-18 | Disposition: A | Discharge status: Home / Self Care | Payer: Medicare Other | Source: Ambulatory Visit | Attending: Family | Admitting: Family

## 2014-08-18 DIAGNOSIS — I739 Peripheral vascular disease, unspecified: Secondary | ICD-10-CM

## 2014-08-18 DIAGNOSIS — I70219 Atherosclerosis of native arteries of extremities with intermittent claudication, unspecified extremity: Secondary | ICD-10-CM

## 2014-08-18 DIAGNOSIS — M79662 Pain in left lower leg: Secondary | ICD-10-CM

## 2014-08-25 ENCOUNTER — Encounter: Payer: Self-pay | Admitting: Family

## 2014-08-29 ENCOUNTER — Ambulatory Visit (HOSPITAL_COMMUNITY)
Admission: RE | Admit: 2014-08-29 | Discharge: 2014-08-29 | Disposition: A | Discharge status: Home / Self Care | Payer: Medicare Other | Source: Ambulatory Visit | Attending: Family | Admitting: Family

## 2014-08-29 ENCOUNTER — Other Ambulatory Visit: Payer: Self-pay | Admitting: Family

## 2014-08-29 ENCOUNTER — Ambulatory Visit (INDEPENDENT_AMBULATORY_CARE_PROVIDER_SITE_OTHER)
Admission: RE | Admit: 2014-08-29 | Discharge: 2014-08-29 | Disposition: A | Discharge status: Home / Self Care | Payer: Medicare Other | Source: Ambulatory Visit | Attending: Family | Admitting: Family

## 2014-08-29 ENCOUNTER — Encounter: Payer: Self-pay | Admitting: Family

## 2014-08-29 ENCOUNTER — Ambulatory Visit (INDEPENDENT_AMBULATORY_CARE_PROVIDER_SITE_OTHER): Payer: Medicare Other | Admitting: Family

## 2014-08-29 VITALS — BP 135/82 | HR 82 | Temp 97.5°F | Resp 16 | Ht 71.0 in | Wt 165.0 lb

## 2014-08-29 DIAGNOSIS — Z95828 Presence of other vascular implants and grafts: Secondary | ICD-10-CM

## 2014-08-29 DIAGNOSIS — Z48812 Encounter for surgical aftercare following surgery on the circulatory system: Secondary | ICD-10-CM

## 2014-08-29 DIAGNOSIS — Z9889 Other specified postprocedural states: Secondary | ICD-10-CM | POA: Diagnosis not present

## 2014-08-29 DIAGNOSIS — I739 Peripheral vascular disease, unspecified: Secondary | ICD-10-CM

## 2014-08-29 DIAGNOSIS — R0989 Other specified symptoms and signs involving the circulatory and respiratory systems: Secondary | ICD-10-CM | POA: Diagnosis not present

## 2014-08-29 DIAGNOSIS — I70219 Atherosclerosis of native arteries of extremities with intermittent claudication, unspecified extremity: Secondary | ICD-10-CM

## 2014-08-29 DIAGNOSIS — Z87891 Personal history of nicotine dependence: Secondary | ICD-10-CM | POA: Diagnosis not present

## 2014-08-29 DIAGNOSIS — M79662 Pain in left lower leg: Secondary | ICD-10-CM

## 2014-08-29 NOTE — Progress Notes (Signed)
[facility] HISTORY AND PHYSICAL -PAD  History of Present Illness Eric Padilla is a 59 y.o. male patient of Dr. Oneida Alar who presents for follow-up evaluation for PAD. He is s/p right to left femoral-femoral bypass graft, right lower extremity femoral-popliteal artery bypass graft with vein and right popliteal endarterectomy performed 01/07/2011.  He is on aspirin for antiplatelet therapy. His atherosclerotic risk factors remain hypertension and former smoking. These are all currently stable and followed by his PCP.  He walks about 20 minutes before both calves claudicate, relieved by rest, he can then continue walking for 1-2 miles with no claudication symptoms. He walks about 2-3 miles daily. He denies non healing wounds. He denies any history of stroke or TIA.  The patient denies New Medical or Surgical History.  Pt Diabetic: No Pt smoker: former smoker (quit in 2015, started smoking in his early 20's, was up to 2 ppd)  Pt meds include: Statin :Yes Betablocker: Yes ASA: Yes Other anticoagulants/antiplatelets: no    Past Medical History  Diagnosis Date  . Carotid artery occlusion   . Leg pain     with walking  . Foot pain 11/22/2010    while lying flat  . Hypertension   . Hyperlipidemia   . Peripheral arterial disease     Social History History  Substance Use Topics  . Smoking status: Former Smoker -- 0.50 packs/day for 20 years    Types: Cigarettes    Start date: 03/30/2014  . Smokeless tobacco: Never Used  . Alcohol Use: 7.2 - 8.4 oz/week    12-14 Standard drinks or equivalent per week    Family History Family History  Problem Relation Age of Onset  . Stroke Mother   . Heart disease Father   . Breast cancer Sister     Past Surgical History  Procedure Laterality Date  . Pr vein bypass graft,aorto-fem-pop  01/07/11    (L)-(R) fem-fem; (R) fem-AK pop BP  . Colonoscopy  May 2015    No Known Allergies  Current Outpatient  Prescriptions  Medication Sig Dispense Refill  . aspirin EC 81 MG tablet Take 1 tablet (81 mg total) by mouth daily. 90 tablet 3  . atorvastatin (LIPITOR) 40 MG tablet Take 1 tablet (40 mg total) by mouth daily. 30 tablet 11  . lisinopril-hydrochlorothiazide (PRINZIDE,ZESTORETIC) 20-25 MG per tablet Take 1 tablet by mouth daily. 90 tablet 3  . metoprolol succinate (TOPROL-XL) 50 MG 24 hr tablet Take 1 tablet (50 mg total) by mouth daily. Take with or immediately following a meal. 90 tablet 3   No current facility-administered medications for this visit.    ROS: See HPI for pertinent positives and negatives.   Physical Examination  Filed Vitals:   08/29/14 1024  BP: 135/82  Pulse: 82  Temp: 97.5 F (36.4 C)  TempSrc: Oral  Resp: 16  Height: 5\' 11"  (1.803 m)  Weight: 165 lb (74.844 kg)  SpO2: 98%   Body mass index is 23.02 kg/(m^2).  General: A&O x 3, WDWN. Gait: normal Eyes: PERRLA. Pulmonary: CTAB, without wheezes , rales or rhonchi. Cardiac: regular Rythm , without detected murmur.     Carotid Bruits Right Left   positive Negative  Aorta is not palpable. Radial pulses: are 2+ palpable and =   VASCULAR EXAM: Extremities without ischemic changes  without Gangrene; without open wounds. Feet are cool, slight duskiness left forefoot.     LE Pulses Right Left   FEMORAL  palpable  palpable  POPLITEAL not palpable  not palpable   POSTERIOR TIBIAL not palpable, triphasic waveforms by Doppler  not palpable, monophasic waveforms by Doppler    DORSALIS PEDIS  ANTERIOR TIBIAL not palpable, triphasic waveforms by Doppler  not palpable, monophasic waveforms by Doppler    Abdomen: soft, NT, no palpable masses. Skin: no rashes, no ulcers noted, purpuric  areas on forearms. Musculoskeletal: no muscle wasting or atrophy. Neurologic: A&O X 3; Appropriate Affect ; SENSATION: normal; MOTOR FUNCTION: moving all extremities equally, motor strength 5/5 throughout. Speech is fluent/normal. CN 2-12 intact.          Non-Invasive Vascular Imaging: DATE: 08/29/2014 [facility] - ILIAC DUPLEX EVALUATION    INDICATION: Peripheral vascular disease     PREVIOUS INTERVENTION(S): Right to left femoral-femoral bypass graft, right femoral-popliteal bypass graft , and right popliteal endarterectomy 01/07/2011.    DUPLEX EXAM:      Peak Systolic Velocity (cm/s)  AORTA - Proximal 80  AORTA - Mid 71  AORTA - Distal 60    RIGHT  LEFT  Peak Systolic Velocity (cm/s) Ratio (if abnormal) Waveform  Peak Systolic Velocity (cm/s) Ratio (if abnormal) Waveform  129  T Common Iliac Artery - Proximal     173  T Common Iliac Artery - Mid     182  B Common Iliac Artery - Distal     288  B External Iliac Artery - Proximal     125  B External Iliac Artery - Mid     196  B External Iliac Artery - Distal     249  T Internal Iliac Artery     0.88/0.53 Today's ABI / TBI 0.57/0  0.84/0.56 Previous ABI / TBI (02/09/2014 ) 0.62/0.28    Waveform:    M - Monophasic       B - Biphasic       T - Triphasic  If Ankle Brachial Index (ABI) or Toe Brachial Index (TBI) performed, please see complete report  ADDITIONAL FINDINGS:     IMPRESSION: Patent abdominal aorta with no hemodynamically significant changes present. Elevated velocities present involving the right internal iliac artery and proximal external iliac artery suggestive of greater than 50% stenosis.    Compared to the previous exam:  Stable right ankle brachial index and decreased left since study on 02/09/2014.    LOWER EXTREMITY ARTERIAL DUPLEX EVALUATION    INDICATION: Peripheral vascular disease    PREVIOUS INTERVENTION(S): Right femoral-left femoral bypass graft, right femoral-popliteal bypass graft, and  right popliteal endarterectomy 01/07/2011.    DUPLEX EXAM:     RIGHT  LEFT   Peak Systolic Velocity (cm/s) Ratio (if abnormal) Waveform  Peak Systolic Velocity (cm/s) Ratio (if abnormal) Waveform  122  B Inflow Artery     116  T Proximal Anastomosis     58  B Proximal Graft     42  B Mid Graft     52  B  Distal Graft     55  B Distal Anastomosis     56/75  B/T Outflow Artery     0.88/0.53 Today's ABI / TBI 0.57/0  0.84/0.56 Previous ABI / TBI (02/09/2014 ) 0.62/0.28    Waveform:    M - Monophasic       B - Biphasic       T - Triphasic  If Ankle Brachial Index (ABI) or Toe Brachial Index (TBI) performed, please see complete report  ADDITIONAL FINDINGS:     IMPRESSION: Patent right to left femoral-femoral bypass  graft, elevated velocities present involving the proximal anastomosis/proximal segment without hemodynamically significant plaque or diameter changes present. Patent right femoral-popliteal artery bypass graft.    Compared to the previous exam:  Stable ankle brachial index on the right and decreased on the left since study on 02/09/2014.     ASSESSMENT: KHAMANI FAIRLEY is a 59 y.o. male who is  s/p right to left femoral-femoral bypass graft, right lower extremity femoral-popliteal artery bypass graft with vein and right popliteal endarterectomy performed 01/07/2011.  Right carotid bruit present today, not auscultated at his last visit, no hx of stroke or TIA, no carotid Duplex result on file, see Plan.  He was congratulated on continued cessation of smoking for the last 5 months.  He has mild to moderate intermittent claudication in both calves, no tissue loss.  Today's aortoiliac Duplex reveals a patent abdominal aorta with no hemodynamically significant changes present. Elevated velocities present involving the right internal iliac artery and proximal external iliac artery suggestive of greater than 50% stenosis. Stable right ankle brachial index and decreased left since  study on 02/09/2014.  Today's right LE arterial Duplex reveals a patent right to left femoral-femoral bypass graft, elevated velocities present involving the proximal anastomosis/proximal segment without hemodynamically significant plaque or diameter changes present. Patent right femoral-popliteal artery bypass graft.    PLAN:  I discussed in depth with the patient the nature of atherosclerosis, and emphasized the importance of maximal medical management including strict control of blood pressure, blood glucose, and lipid levels, obtaining regular exercise, and continued cessation of smoking.  The patient is aware that without maximal medical management the underlying atherosclerotic disease process will progress, limiting the benefit of any interventions.  Based on the patient's vascular studies and examination, pt will return to clinic in 2-3 weeks with carotid Duplex, see me, and in 6 months for for ABI's, right LE arterial Duplex, and fem to fem graft Duplex, see Dr. Oneida Alar.  The patient was given information about PAD including signs, symptoms, treatment, what symptoms should prompt the patient to seek immediate medical care, and risk reduction measures to take.  Clemon Chambers, RN, MSN, FNP-C [facility] Phone: [phone]  Clinic MD: Trula Slade  08/29/2014 10:32 AM

## 2014-08-29 NOTE — Patient Instructions (Signed)

## 2014-09-07 ENCOUNTER — Other Ambulatory Visit: Payer: Self-pay | Admitting: Family Medicine

## 2014-09-08 ENCOUNTER — Ambulatory Visit (HOSPITAL_COMMUNITY)
Admission: RE | Admit: 2014-09-08 | Discharge: 2014-09-08 | Disposition: A | Discharge status: Home / Self Care | Payer: Medicare Other | Source: Ambulatory Visit | Attending: Vascular Surgery | Admitting: Vascular Surgery

## 2014-09-08 DIAGNOSIS — R0989 Other specified symptoms and signs involving the circulatory and respiratory systems: Secondary | ICD-10-CM

## 2014-09-29 ENCOUNTER — Encounter: Payer: Self-pay | Admitting: Gastroenterology

## 2014-11-24 ENCOUNTER — Other Ambulatory Visit: Payer: Self-pay | Admitting: Family Medicine

## 2014-11-24 NOTE — Telephone Encounter (Signed)
I will refill these medications but his BP at his last visit was up and we need to improve that. He needs to schedule an appointment to see me.

## 2014-11-25 NOTE — Telephone Encounter (Signed)
Pt informed and appt made for 12/22/14. Fleeger, Salome Spotted

## 2014-12-22 ENCOUNTER — Ambulatory Visit (INDEPENDENT_AMBULATORY_CARE_PROVIDER_SITE_OTHER): Payer: Medicare Other | Admitting: Family Medicine

## 2014-12-22 ENCOUNTER — Encounter: Payer: Self-pay | Admitting: Family Medicine

## 2014-12-22 VITALS — BP 142/70 | HR 89 | Temp 97.8°F | Ht 71.0 in | Wt 155.8 lb

## 2014-12-22 DIAGNOSIS — I10 Essential (primary) hypertension: Secondary | ICD-10-CM

## 2014-12-22 DIAGNOSIS — F1099 Alcohol use, unspecified with unspecified alcohol-induced disorder: Secondary | ICD-10-CM | POA: Diagnosis not present

## 2014-12-22 DIAGNOSIS — Z720 Tobacco use: Secondary | ICD-10-CM

## 2014-12-22 DIAGNOSIS — Z114 Encounter for screening for human immunodeficiency virus [HIV]: Secondary | ICD-10-CM

## 2014-12-22 DIAGNOSIS — Z7289 Other problems related to lifestyle: Secondary | ICD-10-CM

## 2014-12-22 DIAGNOSIS — Z789 Other specified health status: Secondary | ICD-10-CM

## 2014-12-22 DIAGNOSIS — F17200 Nicotine dependence, unspecified, uncomplicated: Secondary | ICD-10-CM

## 2014-12-22 DIAGNOSIS — I739 Peripheral vascular disease, unspecified: Secondary | ICD-10-CM

## 2014-12-22 LAB — CBC
HEMATOCRIT: 39 % (ref 39.0–52.0)
HEMOGLOBIN: 13.3 g/dL (ref 13.0–17.0)
MCH: 34.8 pg — ABNORMAL HIGH (ref 26.0–34.0)
MCHC: 34.1 g/dL (ref 30.0–36.0)
MCV: 102.1 fL — ABNORMAL HIGH (ref 78.0–100.0)
MPV: 8.9 fL (ref 8.6–12.4)
PLATELETS: 345 10*3/uL (ref 150–400)
RBC: 3.82 MIL/uL — ABNORMAL LOW (ref 4.22–5.81)
RDW: 13.2 % (ref 11.5–15.5)
WBC: 8.7 10*3/uL (ref 4.0–10.5)

## 2014-12-22 LAB — HIV ANTIBODY (ROUTINE TESTING W REFLEX): HIV: NONREACTIVE

## 2014-12-22 MED ORDER — METOPROLOL SUCCINATE ER 50 MG PO TB24: ORAL_TABLET | ORAL | Status: DC

## 2014-12-22 MED ORDER — LISINOPRIL-HYDROCHLOROTHIAZIDE 20-25 MG PO TABS: 1.0000 | ORAL_TABLET | Freq: Every day | ORAL | Status: DC

## 2014-12-22 MED ORDER — ZOSTER VACCINE LIVE 19400 UNT/0.65ML ~~LOC~~ SOLR: 0.6500 mL | Freq: Once | SUBCUTANEOUS | Status: DC

## 2014-12-22 MED ORDER — ASPIRIN EC 81 MG PO TBEC: 81.0000 mg | DELAYED_RELEASE_TABLET | Freq: Every day | ORAL | Status: AC

## 2014-12-22 NOTE — Patient Instructions (Addendum)
I will see you in 1 year or earlier if anything comes up.   Keep trying to quit smoking and come back if you'd like to talk about it or get any help with it.   The zoster (shingles) vaccine has been sent to your pharmacy where they can administer this for you.

## 2014-12-22 NOTE — Assessment & Plan Note (Addendum)
Has cut down to 2 cigarettes per day - discussed at length the role NRT may have for him and the importance of cessation to prevent further atherosclerotic progression. He defers at this time. Will need one time U/S for AAA screening at age 59.

## 2014-12-22 NOTE — Assessment & Plan Note (Signed)
Borderline control with lisinopril-HCTZ 20-25mg  and metoprolol 50mg  with orthostatic symptoms and better control documented at recent visit elsewhere. Will continue this therapy and follow up. Recheck BMP as renal function showed signs of CKD at the last visit.

## 2014-12-22 NOTE — Assessment & Plan Note (Signed)
On ASA, statin (check lipids), BP medications. Reviewed smoking cessation, etc. Seem by vascular for this. Stable claudication symptoms per report and getting ABIs q46mo.

## 2014-12-22 NOTE — Progress Notes (Signed)
Subjective: Eric Padilla is a 59 y.o. male with a history of HTN, PAD, smoking here for follow up of blood pressure.    Eric Padilla has no concerns today, reports taking medications without noticed side effects. He can afford medications and is 100% compliant. Denies HA, vision changes, palpitations, COB, CP. Can walk > 1 mile before claudication symptoms. He does report several episodes (about once per month) of orthostasis with one associated fall that did not require ED visit.  He's smoking about 2 cigarettes per day on average but says they are no longer pleasurable, just a force of habit with coffee or following a meal. He declines any medical assistance for smoking cessation. He is mostly motivated by having SOB worse when smoking and PFTs showing moderate disease.   Review of Systems:  Per HPI. All other systems reviewed and are negative. Has 2 liquor drinks nightly for 40 years, no binge drinking, CAGE 0/4  Objective: BP 142/70 mmHg  Pulse 89  Temp(Src) 97.8 F (36.6 C) (Oral)  Ht 5\' 11"  (1.803 m)  Wt 155 lb 12.8 oz (70.67 kg)  BMI 21.74 kg/m2  Gen: Thin 59 y.o. male in NAD HEENT: MMM, EOMI, PERRL, anicteric sclerae CV: RRR, no MRG, no JVD Resp: Non-labored, CTAB, no wheezes noted Abd: Soft, NTND, BS present, no guarding or organomegaly MSK: No edema noted, full ROM Skin: photoaging evident  Neuro: Alert and oriented, speech normal  Assessment/Plan: Eric Padilla is a 59 y.o. male here for HTN follow up.       See problem list for problem-specific plans.

## 2014-12-23 LAB — LIPID PANEL
Cholesterol: 121 mg/dL (ref 0–200)
HDL: 57 mg/dL (ref >=40)
LDL CALC: 47 mg/dL (ref 0–99)
TRIGLYCERIDES: 83 mg/dL (ref <150)
Total CHOL/HDL Ratio: 2.1 Ratio
VLDL: 17 mg/dL (ref 0–40)

## 2014-12-23 LAB — COMPREHENSIVE METABOLIC PANEL
ALK PHOS: 106 U/L (ref 39–117)
ALT: 13 U/L (ref 0–53)
AST: 22 U/L (ref 0–37)
Albumin: 4.5 g/dL (ref 3.5–5.2)
BUN: 25 mg/dL — AB (ref 6–23)
CHLORIDE: 97 meq/L (ref 96–112)
CO2: 23 mEq/L (ref 19–32)
CREATININE: 1.52 mg/dL — AB (ref 0.50–1.35)
Calcium: 10.3 mg/dL (ref 8.4–10.5)
Glucose, Bld: 113 mg/dL — ABNORMAL HIGH (ref 70–99)
Potassium: 5.6 mEq/L — ABNORMAL HIGH (ref 3.5–5.3)
SODIUM: 136 meq/L (ref 135–145)
TOTAL PROTEIN: 7.1 g/dL (ref 6.0–8.3)
Total Bilirubin: 0.5 mg/dL (ref 0.2–1.2)

## 2014-12-27 ENCOUNTER — Encounter: Payer: Self-pay | Admitting: Family Medicine

## 2014-12-28 ENCOUNTER — Telehealth: Payer: Self-pay | Admitting: Family Medicine

## 2014-12-28 NOTE — Telephone Encounter (Signed)
I have called to discuss his recent labs though he did not answer. The voicemail stated that it was [employer]. Due to the urgent nature of the labs I left a message advising him to stop lisinopril-HCTZ due to high potassium and worsening renal function. He was told to schedule a follow up appointment with me soon to get repeat labs and discuss alternative options for BP control.   If he calls back with questions, I would like the above read to him and emphasize that he needs to: - Stop lisinopril-HCTZ  - Schedule a follow up appointment with me soon  Ryan B. Bonner Puna, MD, PGY-3 12/28/2014 4:30 PM

## 2014-12-28 NOTE — Telephone Encounter (Signed)
Attempted to call again with same response.  Did not LM. Eric Padilla, Eric Padilla

## 2014-12-28 NOTE — Telephone Encounter (Signed)
-----   Message from Patrecia Pour, MD sent at 12/28/2014 4:31 PM EDT -----    Can you please call him again for me to have him STOP lisinopril/HCTZ because of high potassium (not severely high, but requiring discontinuation), and schedule a follow up appointment with me for labs and BP. Thank you so much.

## 2014-12-29 NOTE — Telephone Encounter (Signed)
Pt returned call. He received and understood Dr. Bonner Puna message and had no questions.  Appt made for Thursday August 4th @ 8:30am.  Advised to keep an eye on his BP.  If any SOB, CP, or headaches to give [place] a call or go to emergency room. Eric Padilla, Eric Padilla

## 2015-01-05 ENCOUNTER — Encounter: Payer: Self-pay | Admitting: Family Medicine

## 2015-01-05 ENCOUNTER — Ambulatory Visit (INDEPENDENT_AMBULATORY_CARE_PROVIDER_SITE_OTHER): Payer: Medicare Other | Admitting: Family Medicine

## 2015-01-05 VITALS — BP 136/119 | HR 86 | Temp 97.8°F | Ht 71.0 in | Wt 157.4 lb

## 2015-01-05 DIAGNOSIS — I10 Essential (primary) hypertension: Secondary | ICD-10-CM | POA: Diagnosis not present

## 2015-01-05 DIAGNOSIS — E875 Hyperkalemia: Secondary | ICD-10-CM

## 2015-01-05 DIAGNOSIS — T50905A Adverse effect of unspecified drugs, medicaments and biological substances, initial encounter: Secondary | ICD-10-CM

## 2015-01-05 LAB — BASIC METABOLIC PANEL
BUN: 14 mg/dL (ref 7–25)
CALCIUM: 9.2 mg/dL (ref 8.6–10.3)
CHLORIDE: 103 mmol/L (ref 98–110)
CO2: 26 mmol/L (ref 20–31)
CREATININE: 1.1 mg/dL (ref 0.70–1.25)
Glucose, Bld: 96 mg/dL (ref 65–99)
POTASSIUM: 4.8 mmol/L (ref 3.5–5.3)
Sodium: 139 mmol/L (ref 135–146)

## 2015-01-05 LAB — MAGNESIUM: MAGNESIUM: 1.9 mg/dL (ref 1.5–2.5)

## 2015-01-05 MED ORDER — HYDROCHLOROTHIAZIDE 25 MG PO TABS: 25.0000 mg | ORAL_TABLET | Freq: Every day | ORAL | Status: DC

## 2015-01-05 NOTE — Assessment & Plan Note (Signed)
Continue to hold ACE inhibitor due to HyperK. Check mag and recheck BMP. Restart HCTZ at previous dose (which may help with K as well). Follow up 1 month for BP recheck and possile increase of HCTZ vs. addition of alternative agent vs. no further intervention.

## 2015-01-05 NOTE — Patient Instructions (Signed)
Take HCTZ 25mg  once daily and we will contact you about the results of these blood tests. Make sure to follow up in about 1 month to recheck blood pressure and labs and make appropriate changes. This is very important given your peripheral vascular disease.

## 2015-01-05 NOTE — Progress Notes (Signed)
Subjective: Eric Padilla is a 59 y.o. male patient of mine following up for HTN.  Screening Cr, K at recent office visit revealed creatinine (and BUN) elevation as well as potassium of 5.26mEq/L. There was no visible hemolysis. He was called for this and stopped his ACE inhibitor-HCTZ combination on 7/27 (8 days ago), though has continued metoprolol. He denies any changes in symptoms including no CP, SOB, palpitations, syncope, dizziness, PND, headaches, vision changes, leg swelling. He has chronic claudication which is stable and sleeps on 2-3 pillows under his head but states this is for comfort.   - SH: Drinks 2 liquor drinks daily - not much water or SSBs, smoker (not interested in cessation), denies illicit drugs.  Objective: BP 136/119 mmHg  Pulse 86  Temp(Src) 97.8 F (36.6 C) (Oral)  Ht 5\' 11"  (1.803 m)  Wt 157 lb 6.4 oz (71.396 kg)  BMI 21.96 kg/m2  Recheck manual BP after 5 minutes of relaxing: 150/4mmHg Gen: Thin 59 y.o. male in no distress CV: Regular rate, no murmur; radial, DP and PT pulses palpable bilaterally; no LE edema, no JVD, cap refill < 2 sec. Pulm: Non-labored breathing ambient air; distant, clear sounds, no wheezes or crackles GI: Soft, non-tender, non-distended, no organomegaly  Assessment/Plan: Eric Padilla is a 59 y.o. male here for HTN and lisinopril-induced hyperkalemia..  See problem list for plan.

## 2015-01-06 ENCOUNTER — Encounter: Payer: Self-pay | Admitting: Family Medicine

## 2015-02-01 ENCOUNTER — Other Ambulatory Visit: Payer: Self-pay | Admitting: Family Medicine

## 2015-02-07 ENCOUNTER — Other Ambulatory Visit: Payer: Self-pay | Admitting: *Deleted

## 2015-02-07 DIAGNOSIS — I739 Peripheral vascular disease, unspecified: Secondary | ICD-10-CM

## 2015-02-07 DIAGNOSIS — Z48812 Encounter for surgical aftercare following surgery on the circulatory system: Secondary | ICD-10-CM

## 2015-02-23 ENCOUNTER — Other Ambulatory Visit: Payer: Self-pay | Admitting: Family

## 2015-02-23 ENCOUNTER — Ambulatory Visit (HOSPITAL_COMMUNITY)
Admission: RE | Admit: 2015-02-23 | Discharge: 2015-02-23 | Disposition: A | Discharge status: Home / Self Care | Payer: Medicare Other | Source: Ambulatory Visit | Attending: Vascular Surgery | Admitting: Vascular Surgery

## 2015-02-23 ENCOUNTER — Ambulatory Visit (INDEPENDENT_AMBULATORY_CARE_PROVIDER_SITE_OTHER)
Admission: RE | Admit: 2015-02-23 | Discharge: 2015-02-23 | Disposition: A | Discharge status: Home / Self Care | Payer: Medicare Other | Source: Ambulatory Visit | Attending: Vascular Surgery | Admitting: Vascular Surgery

## 2015-02-23 DIAGNOSIS — I739 Peripheral vascular disease, unspecified: Secondary | ICD-10-CM

## 2015-02-23 DIAGNOSIS — Z48812 Encounter for surgical aftercare following surgery on the circulatory system: Secondary | ICD-10-CM | POA: Diagnosis not present

## 2015-02-23 DIAGNOSIS — Z87891 Personal history of nicotine dependence: Secondary | ICD-10-CM

## 2015-02-23 DIAGNOSIS — I10 Essential (primary) hypertension: Secondary | ICD-10-CM | POA: Diagnosis not present

## 2015-02-23 DIAGNOSIS — E785 Hyperlipidemia, unspecified: Secondary | ICD-10-CM | POA: Insufficient documentation

## 2015-02-23 DIAGNOSIS — Z95828 Presence of other vascular implants and grafts: Secondary | ICD-10-CM

## 2015-02-27 ENCOUNTER — Ambulatory Visit (INDEPENDENT_AMBULATORY_CARE_PROVIDER_SITE_OTHER): Payer: Medicare Other | Admitting: Family Medicine

## 2015-02-27 ENCOUNTER — Encounter: Payer: Self-pay | Admitting: Family Medicine

## 2015-02-27 ENCOUNTER — Telehealth: Payer: Self-pay | Admitting: Family Medicine

## 2015-02-27 VITALS — BP 179/92 | HR 94 | Temp 97.8°F | Ht 71.0 in | Wt 156.4 lb

## 2015-02-27 DIAGNOSIS — I10 Essential (primary) hypertension: Secondary | ICD-10-CM

## 2015-02-27 DIAGNOSIS — I739 Peripheral vascular disease, unspecified: Secondary | ICD-10-CM

## 2015-02-27 DIAGNOSIS — Z23 Encounter for immunization: Secondary | ICD-10-CM

## 2015-02-27 DIAGNOSIS — F17200 Nicotine dependence, unspecified, uncomplicated: Secondary | ICD-10-CM

## 2015-02-27 DIAGNOSIS — Z720 Tobacco use: Secondary | ICD-10-CM

## 2015-02-27 MED ORDER — METOPROLOL SUCCINATE ER 100 MG PO TB24: 100.0000 mg | ORAL_TABLET | Freq: Every day | ORAL | Status: DC

## 2015-02-27 MED ORDER — HYDROCHLOROTHIAZIDE 50 MG PO TABS: 50.0000 mg | ORAL_TABLET | Freq: Every day | ORAL | Status: DC

## 2015-02-27 NOTE — Assessment & Plan Note (Signed)
Reported cessation since last visit. Encouragement was given, will follow up in 1 month.

## 2015-02-27 NOTE — Assessment & Plan Note (Signed)
Tighter control is preferred with such advanced peripheral arterial disease. Will titrate quickly up on beta blocker (HR in 90s today) and thiazide diuretic. History of mild hyperkalemia with ACE, though I don't believe this rule out therapy with low dose ACE/ARB. With increased HCTZ will follow up in 1 month with labs and hope to have room to restart RAAS blocker, likely ARB.

## 2015-02-27 NOTE — Patient Instructions (Signed)
We made the following medication changes today:  - INCREASE the doses of your metoprolol and HCTZ as indicated below.  - High blood pressure is best treated by losing weight, taking medications as directed, avoiding salt in your diet and increasing potassium from fruits and vegetables (called the DASH diet). - Follow up in 1 month for BP check and labs and to see me, or sooner if you have any concerns. If you feel faint, experience new/worsening chest pain or shortness of breath, or notice rapid leg swelling and/or weight gain you should call the clinic at [phone] OR go directly to the ER.  Take care,  - Dr. Bonner Puna  DASH Eating Plan DASH stands for "Dietary Approaches to Stop Hypertension." The DASH eating plan is a healthy eating plan that has been shown to reduce high blood pressure (hypertension). Additional health benefits may include reducing the risk of type 2 diabetes mellitus, heart disease, and stroke. The DASH eating plan may also help with weight loss. WHAT DO I NEED TO KNOW ABOUT THE DASH EATING PLAN? For the DASH eating plan, you will follow these general guidelines:  Choose foods with a percent daily value for sodium of less than 5% (as listed on the food label).  Use salt-free seasonings or herbs instead of table salt or sea salt.  Check with your health care provider or pharmacist before using salt substitutes.  Eat lower-sodium products, often labeled as "lower sodium" or "no salt added."  Eat fresh foods.  Eat more vegetables, fruits, and low-fat dairy products.  Choose whole grains. Look for the word "whole" as the first word in the ingredient list.  Choose fish and skinless chicken or [place] more often than red meat. Limit fish, poultry, and meat to 6 oz (170 g) each day.  Limit sweets, desserts, sugars, and sugary drinks.  Choose heart-healthy fats.  Limit cheese to 1 oz (28 g) per day.  Eat more home-cooked food and less restaurant, buffet, and fast  food.  Limit fried foods.  Cook foods using methods other than frying.  Limit canned vegetables. If you do use them, rinse them well to decrease the sodium.  When eating at a restaurant, ask that your food be prepared with less salt, or no salt if possible. WHAT FOODS CAN I EAT? Seek help from a dietitian for individual calorie needs. Grains Whole grain or whole wheat bread. Brown rice. Whole grain or whole wheat pasta. Quinoa, bulgur, and whole grain cereals. Low-sodium cereals. Corn or whole wheat flour tortillas. Whole grain cornbread. Whole grain crackers. Low-sodium crackers. Vegetables Fresh or frozen vegetables (raw, steamed, roasted, or grilled). Low-sodium or reduced-sodium tomato and vegetable juices. Low-sodium or reduced-sodium tomato sauce and paste. Low-sodium or reduced-sodium canned vegetables.  Fruits All fresh, canned (in natural juice), or frozen fruits. Meat and Other Protein Products Ground beef (85% or leaner), grass-fed beef, or beef trimmed of fat. Skinless chicken or [place]. Ground chicken or [place]. Pork trimmed of fat. All fish and seafood. Eggs. Dried beans, peas, or lentils. Unsalted nuts and seeds. Unsalted canned beans. Dairy Low-fat dairy products, such as skim or 1% milk, 2% or reduced-fat cheeses, low-fat ricotta or cottage cheese, or plain low-fat yogurt. Low-sodium or reduced-sodium cheeses. Fats and Oils Tub margarines without trans fats. Light or reduced-fat mayonnaise and salad dressings (reduced sodium). Avocado. Safflower, olive, or canola oils. Natural peanut or almond butter. Other Unsalted popcorn and pretzels.  WHAT FOODS ARE NOT RECOMMENDED? Grains White bread. White pasta. White rice.  Refined cornbread. Bagels and croissants. Crackers that contain trans fat. Vegetables Creamed or fried vegetables. Vegetables in a cheese sauce. Regular canned vegetables. Regular canned tomato sauce and paste. Regular tomato and vegetable  juices. Fruits Dried fruits. Canned fruit in light or heavy syrup. Fruit juice. Meat and Other Protein Products Fatty cuts of meat. Ribs, chicken wings, bacon, sausage, bologna, salami, chitterlings, fatback, hot dogs, bratwurst, and packaged luncheon meats. Salted nuts and seeds. Canned beans with salt. Dairy Whole or 2% milk, cream, half-and-half, and cream cheese. Whole-fat or sweetened yogurt. Full-fat cheeses or blue cheese. Nondairy creamers and whipped toppings. Processed cheese, cheese spreads, or cheese curds. Condiments Onion and garlic salt, seasoned salt, table salt, and sea salt. Canned and packaged gravies. Worcestershire sauce. Tartar sauce. Barbecue sauce. Teriyaki sauce. Soy sauce, including reduced sodium. Steak sauce. Fish sauce. Oyster sauce. Cocktail sauce. Horseradish. Ketchup and mustard. Meat flavorings and tenderizers. Bouillon cubes. Hot sauce. Tabasco sauce. Marinades. Taco seasonings. Relishes. Fats and Oils Butter, stick margarine, lard, shortening, ghee, and bacon fat. Coconut, palm kernel, or palm oils. Regular salad dressings. Other Pickles and olives. Salted popcorn and pretzels. The items listed above may not be a complete list of foods and beverages to avoid. Contact your dietitian for more information. WHERE CAN I FIND MORE INFORMATION? National [employer], [employer], [employer] Institute: travelstabloid.com

## 2015-02-27 NOTE — Progress Notes (Signed)
Subjective: Eric Padilla is a 59 y.o. male here for hypertension follow up.   He does not check BP at home. Misses zero doses out of 7 days. He is exercising as tolerated (cramping in legs with continued exertion unchanged recently from baseline, and is adherent to a low-salt diet. He eats many vegetables but no fruits. He has no new concerns. Got a repeat LE ultrasound and has vascular surgery follow up later this week.  - ROS: Denies CP, SOB, palpitations, syncope, dizziness, orthopnea, PND, frequent headaches, vision changes, leg swelling. - SH: Stopped smoking since last visit. Reports intermittent cravings and anxiety but is able to cope by talking to people.  - Medications: reviewed and updated. Takes metoprolol 50mg , HCTZ 25mg  daily.  Objective: BP 179/92 mmHg  Pulse 94  Temp(Src) 97.8 F (36.6 C) (Oral)  Ht 5\' 11"  (1.803 m)  Wt 156 lb 6.4 oz (70.943 kg)  BMI 21.82 kg/m2 Gen: Thin 58 y.o.male in no distress, clothes smell of smoke Neck: Brisk carotid upstroke, no bruits; thyroid not enlarged  Pulm: Non-labored breathing room air; CTAB CV: Borderline tachycardic rate with normal S1/S2, no murmur; no LE edema, no JVD; DP and radial pulses symmetric and 2+. Homan's sign absent.     Chemistry      Component Value Date/Time   NA 139 01/05/2015 0851   K 4.8 01/05/2015 0851   CL 103 01/05/2015 0851   CO2 26 01/05/2015 0851   BUN 14 01/05/2015 0851   CREATININE 1.10 01/05/2015 0851   CREATININE <0.47* 01/08/2011 0420      Component Value Date/Time   CALCIUM 9.2 01/05/2015 0851   ALKPHOS 106 12/22/2014 0923   AST 22 12/22/2014 0923   ALT 13 12/22/2014 0923   BILITOT 0.5 12/22/2014 0923     Assessment & Plan: CEFERINO Padilla is a 59 y.o. male here for hypertension, currently uncontrolled.   No problem-specific assessment & plan notes found for this encounter.

## 2015-02-27 NOTE — Telephone Encounter (Signed)
Called pt to inform him that another pt found his "check out" sheet with his appointment-reminder card attached to it. If pt calls back, please inform him that I left it up front for him to pick up if he wishes to do so. Sadie Reynolds, ASA

## 2015-02-28 ENCOUNTER — Encounter: Payer: Self-pay | Admitting: Family

## 2015-03-02 ENCOUNTER — Encounter: Payer: Self-pay | Admitting: Family

## 2015-03-02 ENCOUNTER — Ambulatory Visit (INDEPENDENT_AMBULATORY_CARE_PROVIDER_SITE_OTHER): Payer: Medicare Other | Admitting: Family

## 2015-03-02 VITALS — BP 157/93 | HR 81 | Temp 97.1°F | Resp 16 | Ht 71.0 in | Wt 157.0 lb

## 2015-03-02 DIAGNOSIS — Z7722 Contact with and (suspected) exposure to environmental tobacco smoke (acute) (chronic): Secondary | ICD-10-CM

## 2015-03-02 DIAGNOSIS — Z87891 Personal history of nicotine dependence: Secondary | ICD-10-CM | POA: Diagnosis not present

## 2015-03-02 DIAGNOSIS — Z9889 Other specified postprocedural states: Secondary | ICD-10-CM

## 2015-03-02 DIAGNOSIS — I739 Peripheral vascular disease, unspecified: Secondary | ICD-10-CM

## 2015-03-02 DIAGNOSIS — R0989 Other specified symptoms and signs involving the circulatory and respiratory systems: Secondary | ICD-10-CM

## 2015-03-02 DIAGNOSIS — Z95828 Presence of other vascular implants and grafts: Secondary | ICD-10-CM

## 2015-03-02 NOTE — Patient Instructions (Addendum)
Peripheral Vascular Disease Peripheral Vascular Disease (PVD), also called Peripheral Arterial Disease (PAD), is a circulation problem caused by cholesterol (atherosclerotic plaque) deposits in the arteries. PVD commonly occurs in the lower extremities (legs) but it can occur in other areas of the body, such as your arms. The cholesterol buildup in the arteries reduces blood flow which can cause pain and other serious problems. The presence of PVD can place a person at risk for Coronary Artery Disease (CAD).  CAUSES  Causes of PVD can be many. It is usually associated with more than one risk factor such as:   High Cholesterol.  Smoking.  Diabetes.  Lack of exercise or inactivity.  High blood pressure (hypertension).  Obesity.  Family history. SYMPTOMS   When the lower extremities are affected, patients with PVD may experience:  Leg pain with exertion or physical activity. This is called INTERMITTENT CLAUDICATION. This may present as cramping or numbness with physical activity. The location of the pain is associated with the level of blockage. For example, blockage at the abdominal level (distal abdominal aorta) may result in buttock or hip pain. Lower leg arterial blockage may result in calf pain.  As PVD becomes more severe, pain can develop with less physical activity.  In people with severe PVD, leg pain may occur at rest.  Other PVD signs and symptoms:  Leg numbness or weakness.  Coldness in the affected leg or foot, especially when compared to the other leg.  A change in leg color.  Patients with significant PVD are more prone to ulcers or sores on toes, feet or legs. These may take longer to heal or may reoccur. The ulcers or sores can become infected.  If signs and symptoms of PVD are ignored, gangrene may occur. This can result in the loss of toes or loss of an entire limb.  Not all leg pain is related to PVD. Other medical conditions can cause leg pain such  as:  Blood clots (embolism) or Deep Vein Thrombosis.  Inflammation of the blood vessels (vasculitis).  Spinal stenosis. DIAGNOSIS  Diagnosis of PVD can involve several different types of tests. These can include:  Pulse Volume Recording Method (PVR). This test is simple, painless and does not involve the use of X-rays. PVR involves measuring and comparing the blood pressure in the arms and legs. An ABI (Ankle-Brachial Index) is calculated. The normal ratio of blood pressures is 1. As this number becomes smaller, it indicates more severe disease.  < 0.95 - indicates significant narrowing in one or more leg vessels.  <0.8 - there will usually be pain in the foot, leg or buttock with exercise.  <0.4 - will usually have pain in the legs at rest.  <0.25 - usually indicates limb threatening PVD.  Doppler detection of pulses in the legs. This test is painless and checks to see if you have a pulses in your legs/feet.  A dye or contrast material (a substance that highlights the blood vessels so they show up on x-ray) may be given to help your caregiver better see the arteries for the following tests. The dye is eliminated from your body by the kidney's. Your caregiver may order blood work to check your kidney function and other laboratory values before the following tests are performed:  Magnetic Resonance Angiography (MRA). An MRA is a picture study of the blood vessels and arteries. The MRA machine uses a large magnet to produce images of the blood vessels.  Computed Tomography Angiography (CTA). A CTA   is a specialized x-ray that looks at how the blood flows in your blood vessels. An IV may be inserted into your arm so contrast dye can be injected.  Angiogram. Is a procedure that uses x-rays to look at your blood vessels. This procedure is minimally invasive, meaning a small incision (cut) is made in your groin. A small tube (catheter) is then inserted into the artery of your groin. The catheter  is guided to the blood vessel or artery your caregiver wants to examine. Contrast dye is injected into the catheter. X-rays are then taken of the blood vessel or artery. After the images are obtained, the catheter is taken out. TREATMENT  Treatment of PVD involves many interventions which may include:  Lifestyle changes:  Quitting smoking.  Exercise.  Following a low fat, low cholesterol diet.  Control of diabetes.  Foot care is very important to the PVD patient. Good foot care can help prevent infection.  Medication:  Cholesterol-lowering medicine.  Blood pressure medicine.  Anti-platelet drugs.  Certain medicines may reduce symptoms of Intermittent Claudication.  Interventional/Surgical options:  Angioplasty. An Angioplasty is a procedure that inflates a balloon in the blocked artery. This opens the blocked artery to improve blood flow.  Stent Implant. A wire mesh tube (stent) is placed in the artery. The stent expands and stays in place, allowing the artery to remain open.  Peripheral Bypass Surgery. This is a surgical procedure that reroutes the blood around a blocked artery to help improve blood flow. This type of procedure may be performed if Angioplasty or stent implants are not an option. SEEK IMMEDIATE MEDICAL CARE IF:   You develop pain or numbness in your arms or legs.  Your arm or leg turns cold, becomes blue in color.  You develop redness, warmth, swelling and pain in your arms or legs. MAKE SURE YOU:   Understand these instructions.  Will watch your condition.  Will get help right away if you are not doing well or get worse. Document Released: 06/27/2004 Document Revised: 08/12/2011 Document Reviewed: 05/24/2008 ExitCare Patient Information 2015 [employer]. This information is not intended to replace advice given to you by your health care provider. Make sure you discuss any questions you have with your health care provider.   Secondhand  Smoke Secondhand smoke is the smoke exhaled by smokers and the smoke given off by a burning cigarette, cigar, or pipe. When a cigarette is smoked, about half of the smoke is inhaled and exhaled by the smoker, and the other half floats around in the air. Exposure to secondhand smoke is also called involuntary smoking or passive smoking. People can be exposed to secondhand smoke in:   Homes.  Cars.  Workplaces.  Public places (bars, restaurants, other recreation sites). Exposure to secondhand smoke is hazardous.It contains more than 250 harmful chemicals, including at least 60 that can cause cancer. These chemicals include:  Arsenic, a heavy metal toxin.  Benzene, a chemical found in gasoline.  Beryllium, a toxic metal.  Cadmium, a metal used in batteries.  Chromium, a metallic element.  Ethylene oxide, a chemical used to sterilize medical devices.  Nickel, a metallic element.  Polonium-210, a chemical element that gives off radiation.  Vinyl chloride, a toxic substance used in the manufacture of plastics. Nonsmoking spouses and family members of smokers have higher rates of cancer, heart disease, and serious respiratory illnesses than those not exposed to secondhand smoke.  Nicotine, a nicotine by-product called cotinine, carbon monoxide, and other evidence of   secondhand smoke exposure have been found in the body fluids of nonsmokers exposed to secondhand smoke.  Living with a smoker may increase a nonsmoker's chances of developing lung cancer by 20 to 30 percent.  Secondhand smoke may increase the risk of breast cancer, nasal sinus cavity cancer, cervical cancer, bladder cancer, and nose and throat (nasopharyngeal) cancer in adults.  Secondhand smoke may increase the risk of heart disease by 25 to 30 percent. Children are especially at risk from secondhand smoke exposure. Children of smokers have higher rates  of:  Pneumonia.  Asthma.  Smoking.  Bronchitis.  Colds.  Chronic cough.  Ear infections.  Tonsilitis.  School absences. Research suggests that exposure to secondhand smoke may cause leukemia, lymphoma, and brain tumors in children. Babies are three times more likely to die from sudden infant death syndrome (SIDS) if their mothers smoked during and after pregnancy. There is no safe level of exposure to secondhand smoke. Studies have shown that even low levels of exposure can be harmful. The only way to fully protect nonsmokers from secondhand smoke exposure is to completely eliminate smoking in indoor spaces. The best thing you can do for your own health and for your children's health is to stop smoking. You should stop as soon as possible. This is not easy, and you may fail several times at quitting before you get free of this addiction. Nicotine replacement therapy ( such as patches, gum, or lozenges) can help. These therapies can help you deal with the physical symptoms of withdrawal. Attending quit-smoking support groups can help you deal with the emotional issues of quitting smoking.  Even if you are not ready to quit right now, there are some simple changes you can make to reduce the effect of your smoking on your family:  Do not smoke in your home. Smoke away from your home in an open area, preferably outside.  Ask others to not smoke in your home.  Do not smoke while holding a child or when children are near.  Do not smoke in your car.  Avoid restaurants, day care centers, and other places that allow smoking. Document Released: 06/27/2004 Document Revised: 02/12/2012 Document Reviewed: 09/03/2013 ExitCare Patient Information 2015 [employer]. This information is not intended to replace advice given to you by your health care provider. Make sure you discuss any questions you have with your health care provider.  

## 2015-03-02 NOTE — Progress Notes (Signed)
Filed Vitals:   03/02/15 0906 03/02/15 0911  BP: 154/93 157/93  Pulse: 83 81  Temp: 97.1 F (36.2 C)   TempSrc: Oral   Resp: 16   Height: 5\' 11"  (1.803 m)   Weight: 157 lb (71.215 kg)   SpO2: 94%

## 2015-03-02 NOTE — Progress Notes (Signed)
[facility] [place] HISTORY AND PHYSICAL -PAD  History of Present Illness Eric Padilla is a 59 y.o. male  patient of Dr. Oneida Alar who presents for follow-up evaluation for PAD. He is s/p right to left femoral-femoral bypass graft, right lower extremity femoral-popliteal artery bypass graft with vein and right popliteal endarterectomy performed 01/07/2011.  He is on aspirin for antiplatelet therapy. His atherosclerotic risk factors remain hypertension and former smoking. These are all currently stable and followed by his PCP.  He walks about 20 minutes before both calves claudicate, relieved by rest, he can then continue walking for 1-2 miles with no claudication symptoms. He walks about 2-3 miles daily. He denies non healing wounds. He denies any history of stroke or TIA.  The patient denies New Medical or Surgical History.  Pt Diabetic: No Pt smoker: former smoker (quit in August, 2016, started smoking in his early 20's, was up to 2 ppd). However, he is exposed in his home to secondhand smoke.  Pt meds include: Statin :Yes Betablocker: Yes ASA: Yes Other anticoagulants/antiplatelets: no    Past Medical History  Diagnosis Date  . Carotid artery occlusion   . Leg pain     with walking  . Foot pain 11/22/2010    while lying flat  . Hypertension   . Hyperlipidemia   . Peripheral arterial disease     Social History Social History  Substance Use Topics  . Smoking status: Former Smoker -- 0.15 packs/day for 20 years    Types: Cigarettes    Start date: 03/30/2014    Quit date: 01/27/2015  . Smokeless tobacco: Never Used  . Alcohol Use: 7.2 - 8.4 oz/week    12-14 Standard drinks or equivalent per week    Family History Family History  Problem Relation Age of Onset  . Stroke Mother   . Heart disease Father   . Breast cancer Sister     Past Surgical History  Procedure Laterality Date  . Pr vein bypass graft,aorto-fem-pop  01/07/11    (L)-(R)  fem-fem; (R) fem-AK pop BP  . Colonoscopy  May 2015    No Known Allergies  Current Outpatient Prescriptions  Medication Sig Dispense Refill  . aspirin EC 81 MG tablet Take 1 tablet (81 mg total) by mouth daily. 90 tablet 3  . atorvastatin (LIPITOR) 40 MG tablet Take 1 tablet (40 mg total) by mouth daily. 30 tablet 11  . hydrochlorothiazide (HYDRODIURIL) 50 MG tablet Take 1 tablet (50 mg total) by mouth daily. 90 tablet 0  . metoprolol succinate (TOPROL-XL) 100 MG 24 hr tablet Take 1 tablet (100 mg total) by mouth daily. 90 tablet 0  . zoster vaccine live, PF, (ZOSTAVAX) 64332 UNT/0.65ML injection Inject 19,400 Units into the skin once. 1 each 0   No current facility-administered medications for this visit.    ROS: See HPI for pertinent positives and negatives.   Physical Examination  Filed Vitals:   03/02/15 0906 03/02/15 0911  BP: 154/93 157/93  Pulse: 83 81  Temp: 97.1 F (36.2 C)   TempSrc: Oral   Resp: 16   Height: 5\' 11"  (1.803 m)   Weight: 157 lb (71.215 kg)   SpO2: 94%    Body mass index is 21.91 kg/(m^2).  General: A&O x 3, WDWN. Gait: normal Eyes: PERRLA. Pulmonary: CTAB, without wheezes , rales or rhonchi. Cardiac: regular Rythm , without detected murmur.     Carotid Bruits Right Left   positive Negative  Aorta is not  palpable. Radial pulses: are 2+ palpable and = Fem-fem bypass graft pulse is strongly palpable at pubis   VASCULAR EXAM: Extremities without ischemic changes  without Gangrene; without open wounds.      LE Pulses Right Left   FEMORAL  palpable  palpable    POPLITEAL not palpable  not palpable   POSTERIOR TIBIAL 2+ palpable  not palpable, monophasic waveforms by Doppler    DORSALIS PEDIS  ANTERIOR  TIBIAL not palpable, triphasic waveforms by Doppler  not palpable, monophasic waveforms by Doppler    Abdomen: soft, NT, no palpable masses. Skin: no rashes, no ulcers, purpuric areas on forearms. Musculoskeletal: no muscle wasting or atrophy. Neurologic: A&O X 3; Appropriate Affect ; SENSATION: normal; MOTOR FUNCTION: moving all extremities equally, motor strength 5/5 throughout. Speech is fluent/normal. CN 2-12 intact.                Non-Invasive Vascular Imaging: DATE: 02/23/15 LOWER EXTREMITY ARTERIAL DUPLEX EVALUATION  FEMORAL-FEMORAL BYPASS GRAFT    INDICATION: Peripheral vascular disease    PREVIOUS INTERVENTION(S): Right to left femoral -femoral bypass graft, right femoral - popliteal bypass graft and right popliteal endarterectomy 01/07/2011.    DUPLEX EXAM:     LOCATION Velocities                (cm/s) Diameter AP                          (cm) Diameter Transverse (cm)  Inflow Artery     Right external iliac artery   289    Anastomosis       Right common femoral artery       79 1.81 1.96  Graft         56/38/43    Anastomosis       Left common femoral artery  46 1.30 1.52  Outflow Artery   Left common femoral artery   80       Right  Left  Today's ABI/TBI 1.03/0.65 0.74/0.43  Previous ABI/TBI   08/29/2014   0.88/0.53 0.57/0    If Ankle Brachial Index (ABI) or Toe Brachial Index (TBI) performed, please see complete report   ADDITIONAL FINDINGS: Thin anechoic area surrounding graft with no evidence of extraluminal flow, not vascular in nature.     IMPRESSION: Patent right femoral to left femoral bypass graft, no hemodynamically significant stenosis present.    Compared to the previous exam:  Improved ankle brachial indices since previous study on 08/29/2014.      LOWER EXTREMITY ARTERIAL DUPLEX EVALUATION    INDICATION: Peripheral vascular disease     PREVIOUS INTERVENTION(S): Right to left femoral-femoral bypass graft, right  femoral-popliteal bypass graft and right popliteal endarterectomy 01/07/2011.    DUPLEX EXAM:     RIGHT  LEFT   Peak Systolic Velocity (cm/s) Ratio (if abnormal) Waveform  Peak Systolic Velocity (cm/s) Ratio (if abnormal) Waveform  237  T Inflow Artery     116  B Proximal Anastomosis     65  B Proximal Graft     75  B Mid Graft     52  B  Distal Graft     52  B Distal Anastomosis     46/49/102 2.22 B/B/B Outflow Artery     1.03/0.65 Today's ABI / TBI 0.74/0.43  0.88/0.53 Previous ABI / TBI (08/29/2014  ) 0.57/0    Waveform:    M - Monophasic       B -  Biphasic       T - Triphasic  If Ankle Brachial Index (ABI) or Toe Brachial Index (TBI) performed, please see complete report     ADDITIONAL FINDINGS:     IMPRESSION: Patent right femoral to popliteal artery bypass graft, no hemodynamically significant stenosis. Elevated velocities present at the native tibioperoneal trunk suggestive of 50%-74% stenosis distal to bypass graft distal anastomosis.    Compared to the previous exam:  Improved ankle brachial indices since previous study on 08/29/2014.     ASSESSMENT: Eric Padilla is a 59 y.o. male who is s/p right to left femoral-femoral bypass graft, right lower extremity femoral-popliteal artery bypass graft with vein and right popliteal endarterectomy performed 01/07/2011.  He has a right carotid bruit, no history of stroke or TIA.  He has mild bilateral calf claudication with walking and he walks 2-3 miles daily. Today's femoral to femoral bypass graft duplex suggests a patent right femoral to left femoral bypass graft, no hemodynamically significant stenosis. Today's right LE arterial duplex suggests a patent right femoral to popliteal artery bypass graft, no hemodynamically significant stenosis. Elevated velocities present at the native tibioperoneal trunk suggestive of 50%-74% stenosis distal to bypass graft distal anastomosis. Improved ankle brachial indices since previous study  on 08/29/2014; improved to normal from mild arterial occlusive disease in the right leg, improved to moderate from severe in the left leg since pt stopped smoking a month ago and has increased his walking. Pt is exposed to chronic second hand smoke from a person that lives with him smokes in his residence.   Face to face time with patient was 25 minutes. Over 50% of this time was spent on counseling and coordination of care.  PLAN:  Continue graduated walking program.   Based on the patient's vascular studies and examination, pt will return to clinic in 1 year for ABI's, right LE arterial Duplex, fem to fem graft Duplex, and carotid duplex. He knows to return sooner if needed.  I discussed in depth with the patient the nature of atherosclerosis, and emphasized the importance of maximal medical management including strict control of blood pressure, blood glucose, and lipid levels, obtaining regular exercise, and continued cessation of smoking.  The patient is aware that without maximal medical management the underlying atherosclerotic disease process will progress, limiting the benefit of any interventions.  The patient was given information about PAD including signs, symptoms, treatment, what symptoms should prompt the patient to seek immediate medical care, and risk reduction measures to take.  Clemon Chambers, RN, MSN, FNP-C [facility] Phone: [phone]  Clinic MD: [facility]  03/02/2015 9:16 AM

## 2015-03-27 ENCOUNTER — Encounter: Payer: Self-pay | Admitting: Family Medicine

## 2015-03-27 ENCOUNTER — Ambulatory Visit (INDEPENDENT_AMBULATORY_CARE_PROVIDER_SITE_OTHER): Payer: Medicare Other | Admitting: Family Medicine

## 2015-03-27 VITALS — BP 146/80 | HR 86 | Ht 71.0 in | Wt 161.0 lb

## 2015-03-27 DIAGNOSIS — Z1159 Encounter for screening for other viral diseases: Secondary | ICD-10-CM

## 2015-03-27 DIAGNOSIS — I10 Essential (primary) hypertension: Secondary | ICD-10-CM

## 2015-03-27 DIAGNOSIS — Z5181 Encounter for therapeutic drug level monitoring: Secondary | ICD-10-CM

## 2015-03-27 DIAGNOSIS — D126 Benign neoplasm of colon, unspecified: Secondary | ICD-10-CM

## 2015-03-27 LAB — HEPATITIS C ANTIBODY: HCV Ab: NEGATIVE

## 2015-03-27 LAB — BASIC METABOLIC PANEL WITH GFR
BUN: 14 mg/dL (ref 7–25)
CHLORIDE: 95 mmol/L — AB (ref 98–110)
CO2: 29 mmol/L (ref 20–31)
CREATININE: 1.06 mg/dL (ref 0.70–1.25)
Calcium: 10 mg/dL (ref 8.6–10.3)
GFR, Est African American: 87 mL/min (ref >=60)
GFR, Est Non African American: 75 mL/min (ref >=60)
Glucose, Bld: 102 mg/dL — ABNORMAL HIGH (ref 65–99)
POTASSIUM: 4.2 mmol/L (ref 3.5–5.3)
SODIUM: 138 mmol/L (ref 135–146)

## 2015-03-27 LAB — TSH: TSH: 1.899 u[IU]/mL (ref 0.350–4.500)

## 2015-03-27 NOTE — Progress Notes (Signed)
Subjective: Eric Padilla is a 59 y.o. male here for hypertension follow up.   He does not check BP at home. Misses zero doses out of 7 days. He is exercising as tolerated (cramping in legs with continued exertion unchanged recently from baseline, and is adherent to a low-salt diet. He eats many vegetables but no fruits.   - Due for colonoscopy (12/07/2013 w/[facility] showed adenomatous polyps, 1 year f/u recommended, but has not been able to find someone to accompany him to this visit. He will ask if he can go by bus with a friend accompanying him.  - ROS: Denies CP, SOB, palpitations, syncope, dizziness/orthostasis, orthopnea, PND, frequent headaches, vision changes, leg swelling. - Medications: reviewed and updated. - SH: Still not smoking. Emphasized the significant contribution to wellness this portends.  - No personal or family history of thyroid disease.   Objective: BP 146/80 mmHg  Pulse 86  Ht 5\' 11"  (1.803 m)  Wt 161 lb (73.029 kg)  BMI 22.46 kg/m2 Gen: Thin 59 y.o.male in no distress Neck: Brisk carotid upstroke, no bruits; thyroid not enlarged  Pulm: Non-labored breathing room air; distant, clear breaths  CV: Borderline tachycardic rate with normal S1/S2, no murmur; no LE edema, no JVD; DP and radial pulses symmetric and 2+. Homan's sign absent.   Assessment & Plan: Eric Padilla is a 59 y.o. male here for hypertension, currently uncontrolled.   Essential hypertension BP improved significantly today, still not at goal on metoprolol 100mg  and HCTZ 50mg . HR still in high 80s despite significant BB dose - check TSH. H/o K of 5.6 on ACEi. No hypotensive symptoms. Will check BMP and call back to determine the most appropriate agent. If K wnl, will try low dose ACE or ARB (he has united healthcare medicare), otherwise consider norvasc. Side effects of both reviewed today.   Adenomatous polyp of colon Refer for repeat C-scope.

## 2015-03-27 NOTE — Assessment & Plan Note (Addendum)
BP improved significantly today, still not at goal on metoprolol 100mg  and HCTZ 50mg . HR still in high 80s despite significant BB dose - check TSH. H/o K of 5.6 on ACEi. No hypotensive symptoms. Will check BMP and call back to determine the most appropriate agent. If K wnl, will try low dose ACE or ARB (he has united healthcare medicare), otherwise consider norvasc. Side effects of both reviewed today.

## 2015-03-27 NOTE — Patient Instructions (Addendum)
Thank you for coming in today!  We are checking some labs today, and we'll call you to discuss our next steps which may be adding a low dose of another medicine to make sure your blood pressure remains at goal range.   Congratulations on your continued not smoking. This will continue to help [place] keep you well.   Our clinic's number is [phone]. Feel free to call any time with questions or concerns. We will answer any questions after hours with our 24-hour emergency line at that number as well.   - Dr. Bonner Puna

## 2015-03-27 NOTE — Assessment & Plan Note (Signed)
Refer for repeat C-scope.

## 2015-03-28 ENCOUNTER — Telehealth: Payer: Self-pay | Admitting: Family Medicine

## 2015-03-28 ENCOUNTER — Encounter: Payer: Self-pay | Admitting: Family Medicine

## 2015-03-28 MED ORDER — LOSARTAN POTASSIUM 25 MG PO TABS
25.0000 mg | ORAL_TABLET | Freq: Every day | ORAL | Status: DC
Start: 2015-03-28 — End: 2015-05-05

## 2015-03-28 NOTE — Telephone Encounter (Signed)
Relayed normal creatinine and K to pt. After discussion he is ready to try a low dose of losartan 25mg  for the next 4 weeks at which time he will schedule a follow up for BP and BMET check.

## 2015-05-05 ENCOUNTER — Other Ambulatory Visit: Payer: Self-pay | Admitting: Family Medicine

## 2015-05-30 ENCOUNTER — Telehealth: Payer: Self-pay | Admitting: Family Medicine

## 2015-05-30 DIAGNOSIS — I10 Essential (primary) hypertension: Secondary | ICD-10-CM

## 2015-05-30 DIAGNOSIS — I739 Peripheral vascular disease, unspecified: Secondary | ICD-10-CM

## 2015-05-30 NOTE — Telephone Encounter (Signed)
Pt called and needs a refill on both of his BP medications. jw

## 2015-05-31 MED ORDER — METOPROLOL SUCCINATE ER 100 MG PO TB24: 100.0000 mg | ORAL_TABLET | Freq: Every day | ORAL | Status: DC

## 2015-05-31 MED ORDER — HYDROCHLOROTHIAZIDE 50 MG PO TABS: 50.0000 mg | ORAL_TABLET | Freq: Every day | ORAL | Status: DC

## 2015-05-31 NOTE — Telephone Encounter (Signed)
Patient scheduled for 06/13/15 with pcp. Jazmin Hartsell,CMA

## 2015-06-13 ENCOUNTER — Ambulatory Visit (INDEPENDENT_AMBULATORY_CARE_PROVIDER_SITE_OTHER): Payer: Medicare Other | Admitting: Family Medicine

## 2015-06-13 ENCOUNTER — Encounter: Payer: Self-pay | Admitting: Family Medicine

## 2015-06-13 VITALS — BP 159/78 | HR 88 | Temp 97.4°F | Ht 71.0 in | Wt 163.0 lb

## 2015-06-13 DIAGNOSIS — I10 Essential (primary) hypertension: Secondary | ICD-10-CM

## 2015-06-13 MED ORDER — LOSARTAN POTASSIUM 50 MG PO TABS: 50.0000 mg | ORAL_TABLET | Freq: Every day | ORAL | Status: DC

## 2015-06-13 NOTE — Progress Notes (Signed)
Subjective: Eric Padilla is a 60 y.o. male here for hypertension follow up.   Doesn't check home BP. Has not problem obtaining and taking medications. Denies cough, HA, vision changes, CP, SOB, palpitations, syncope, dizziness, orthopnea, PND, frequent headaches, vision changes, new/change in claudication, leg swelling. - PMFSH: Smoker up until about 2 months ago, 2 mixed drinks per day in the evening. No illicit drugs. - Medications: reviewed and updated  Objective: BP 159/78 mmHg  Pulse 88  Temp(Src) 97.4 F (36.3 C) (Oral)  Ht 5\' 11"  (1.803 m)  Wt 163 lb (73.936 kg)  BMI 22.74 kg/m2 Gen: Well-appearing 59 y.o.male in no distress smelling of smoke Neck: + brisk carotid upstroke, no bruits; thyroid not enlarged  Pulm: Non-labored breathing room air; CTAB CV: Regular rate with normal S1/S2, no murmur; no LE edema, no JVD; DP and radial pulses symmetric and 2+. Homan's sign absent. cap refill < 2 sec.  GI: Nontender, nondistended, no HSM     Chemistry     Component Value Date/Time   NA 138 03/27/2015 0905   K 4.2 03/27/2015 0905   CL 95* 03/27/2015 0905   CO2 29 03/27/2015 0905   BUN 14 03/27/2015 0905   CREATININE 1.06 03/27/2015 0905   CREATININE <0.47* 01/08/2011 0420     Assessment & Plan: Eric Padilla is a 60 y.o. male here for uncontrolled resistant HTN.   Essential hypertension Likely worsened by extensive atherosclerotic burden, history of smoking (quit for 2 months!), alcohol (advised moderation). Again emphasized the elements of the DASH diet and walking regularly. Increase losartan to 50mg  and recheck BMP with BP in 2 weeks. If uncontrolled, consider referral.

## 2015-06-13 NOTE — Patient Instructions (Signed)
START taking losartan (cozaar) 50mg  per day. This will be 2 of your current 25mg  tablets until you run out, then start taking 1 of the 50mg  tablets I just sent to your pharmacy.   Return in 2 - 3 weeks for follow up of blood pressure with labs.

## 2015-06-13 NOTE — Assessment & Plan Note (Signed)
Likely worsened by extensive atherosclerotic burden, history of smoking (quit for 2 months!), alcohol (advised moderation). Again emphasized the elements of the DASH diet and walking regularly. Increase losartan to 50mg  and recheck BMP with BP in 2 weeks. If uncontrolled, consider referral.

## 2015-06-27 ENCOUNTER — Ambulatory Visit (INDEPENDENT_AMBULATORY_CARE_PROVIDER_SITE_OTHER): Payer: Medicare Other | Admitting: Family Medicine

## 2015-06-27 ENCOUNTER — Encounter: Payer: Self-pay | Admitting: Family Medicine

## 2015-06-27 DIAGNOSIS — I10 Essential (primary) hypertension: Secondary | ICD-10-CM

## 2015-06-27 LAB — BASIC METABOLIC PANEL WITH GFR
BUN: 22 mg/dL (ref 7–25)
CALCIUM: 9.8 mg/dL (ref 8.6–10.3)
CO2: 34 mmol/L — AB (ref 20–31)
CREATININE: 1.14 mg/dL (ref 0.70–1.25)
Chloride: 98 mmol/L (ref 98–110)
GFR, EST NON AFRICAN AMERICAN: 69 mL/min (ref >=60)
GFR, Est African American: 80 mL/min (ref >=60)
Glucose, Bld: 107 mg/dL — ABNORMAL HIGH (ref 65–99)
Potassium: 4.1 mmol/L (ref 3.5–5.3)
SODIUM: 136 mmol/L (ref 135–146)

## 2015-06-27 NOTE — Patient Instructions (Signed)
Thank you for coming to see me today. It was a pleasure. Today we talked about:   Hypertension (high blood pressure): I will get a lab test to check your kidney function. You will either get a letter in the mail or a telephone call. If you do not receive either in the next week, please call our office. For now, please continue your current dose of losartan, metoprolol and hydrochlorothiazide.   Please make an appointment to see Dr. Bonner Puna for routine follow-up (3 months) or sooner  If you have any questions or concerns, please do not hesitate to call the office at [phone].  Sincerely,  Cordelia Poche, MD

## 2015-06-27 NOTE — Assessment & Plan Note (Signed)
Blood pressure controlled today. Patient tolerating increase in losartan. Will obtain BMP today to assess creatinine. Otherwise, follow-up with PCP for routine care.

## 2015-06-27 NOTE — Progress Notes (Signed)
    Subjective    Eric Padilla is a 60 y.o. male that presents for a follow-up visit for:   1. Hypertension: Patient is adherent with losartan 50mg  daily, hydrochlorothiazide 50mg  and metoprolol xl 100mg . No chest pain or dyspnea. No edema. He reports no noticeable side effects to increase of losartan.   Social History  Substance Use Topics  . Smoking status: Former Smoker -- 0.15 packs/day for 20 years    Types: Cigarettes    Start date: 03/30/2014    Quit date: 01/27/2015  . Smokeless tobacco: Never Used  . Alcohol Use: 7.2 - 8.4 oz/week    12-14 Standard drinks or equivalent per week    No Known Allergies  No orders of the defined types were placed in this encounter.    ROS  Per HPI   Objective   BP 125/60 mmHg  Pulse 86  Temp(Src) 97.7 F (36.5 C) (Oral)  Ht 5\' 11"  (1.803 m)  Wt 163 lb 9.6 oz (74.208 kg)  BMI 22.83 kg/m2  Vital signs reviewed  General: Well appearing, no distress Respiratory/Chest: Clear to auscultation bilaterally. Unlabored work of breathing. No wheezing or rales. Cardiovascular: Regular rate and rhythm. Normal S1 and S2. No heart murmurs present. No extra heart sounds Musculoskeletal: No edema  Assessment and Plan    Essential hypertension Blood pressure controlled today. Patient tolerating increase in losartan. Will obtain BMP today to assess creatinine. Otherwise, follow-up with PCP for routine care.

## 2015-08-03 ENCOUNTER — Ambulatory Visit (INDEPENDENT_AMBULATORY_CARE_PROVIDER_SITE_OTHER): Payer: Medicare Other | Admitting: Family Medicine

## 2015-08-03 ENCOUNTER — Encounter: Payer: Self-pay | Admitting: Family Medicine

## 2015-08-03 VITALS — BP 138/62 | HR 94 | Temp 97.7°F | Ht 71.0 in | Wt 162.2 lb

## 2015-08-03 DIAGNOSIS — I10 Essential (primary) hypertension: Secondary | ICD-10-CM

## 2015-08-03 NOTE — Assessment & Plan Note (Addendum)
At goal. Continue current medications. Reporst increased urination without LUTS consistent with HCTZ. After discussion, he opts to continue this in lieu of changing to norvasc as it took a while to get HTN under control. Cr and K checked and ok at last visit.

## 2015-08-03 NOTE — Patient Instructions (Signed)
We made no medication changes today:  - If you are fed up with peeing frequently we can consider a different medication. Just let me know.  - See you in about 3 months  Take care,  - Dr. Bonner Puna  DASH Eating Plan DASH stands for "Dietary Approaches to Stop Hypertension." The DASH eating plan is a healthy eating plan that has been shown to reduce high blood pressure (hypertension). Additional health benefits may include reducing the risk of type 2 diabetes mellitus, heart disease, and stroke. The DASH eating plan may also help with weight loss. WHAT DO I NEED TO KNOW ABOUT THE DASH EATING PLAN? For the DASH eating plan, you will follow these general guidelines:  Choose foods with a percent daily value for sodium of less than 5% (as listed on the food label).  Use salt-free seasonings or herbs instead of table salt or sea salt.  Check with your health care provider or pharmacist before using salt substitutes.  Eat lower-sodium products, often labeled as "lower sodium" or "no salt added."  Eat fresh foods.  Eat more vegetables, fruits, and low-fat dairy products.  Choose whole grains. Look for the word "whole" as the first word in the ingredient list.  Choose fish and skinless chicken or [place] more often than red meat. Limit fish, poultry, and meat to 6 oz (170 g) each day.  Limit sweets, desserts, sugars, and sugary drinks.  Choose heart-healthy fats.  Limit cheese to 1 oz (28 g) per day.  Eat more home-cooked food and less restaurant, buffet, and fast food.  Limit fried foods.  Cook foods using methods other than frying.  Limit canned vegetables. If you do use them, rinse them well to decrease the sodium.  When eating at a restaurant, ask that your food be prepared with less salt, or no salt if possible. WHAT FOODS CAN I EAT? Seek help from a dietitian for individual calorie needs. Grains Whole grain or whole wheat bread. Brown rice. Whole grain or whole wheat pasta.  Quinoa, bulgur, and whole grain cereals. Low-sodium cereals. Corn or whole wheat flour tortillas. Whole grain cornbread. Whole grain crackers. Low-sodium crackers. Vegetables Fresh or frozen vegetables (raw, steamed, roasted, or grilled). Low-sodium or reduced-sodium tomato and vegetable juices. Low-sodium or reduced-sodium tomato sauce and paste. Low-sodium or reduced-sodium canned vegetables.  Fruits All fresh, canned (in natural juice), or frozen fruits. Meat and Other Protein Products Ground beef (85% or leaner), grass-fed beef, or beef trimmed of fat. Skinless chicken or [place]. Ground chicken or [place]. Pork trimmed of fat. All fish and seafood. Eggs. Dried beans, peas, or lentils. Unsalted nuts and seeds. Unsalted canned beans. Dairy Low-fat dairy products, such as skim or 1% milk, 2% or reduced-fat cheeses, low-fat ricotta or cottage cheese, or plain low-fat yogurt. Low-sodium or reduced-sodium cheeses. Fats and Oils Tub margarines without trans fats. Light or reduced-fat mayonnaise and salad dressings (reduced sodium). Avocado. Safflower, olive, or canola oils. Natural peanut or almond butter. Other Unsalted popcorn and pretzels.  WHAT FOODS ARE NOT RECOMMENDED? Grains White bread. White pasta. White rice. Refined cornbread. Bagels and croissants. Crackers that contain trans fat. Vegetables Creamed or fried vegetables. Vegetables in a cheese sauce. Regular canned vegetables. Regular canned tomato sauce and paste. Regular tomato and vegetable juices. Fruits Dried fruits. Canned fruit in light or heavy syrup. Fruit juice. Meat and Other Protein Products Fatty cuts of meat. Ribs, chicken wings, bacon, sausage, bologna, salami, chitterlings, fatback, hot dogs, bratwurst, and packaged luncheon meats. Salted  nuts and seeds. Canned beans with salt. Dairy Whole or 2% milk, cream, half-and-half, and cream cheese. Whole-fat or sweetened yogurt. Full-fat cheeses or blue cheese. Nondairy  creamers and whipped toppings. Processed cheese, cheese spreads, or cheese curds. Condiments Onion and garlic salt, seasoned salt, table salt, and sea salt. Canned and packaged gravies. Worcestershire sauce. Tartar sauce. Barbecue sauce. Teriyaki sauce. Soy sauce, including reduced sodium. Steak sauce. Fish sauce. Oyster sauce. Cocktail sauce. Horseradish. Ketchup and mustard. Meat flavorings and tenderizers. Bouillon cubes. Hot sauce. Tabasco sauce. Marinades. Taco seasonings. Relishes. Fats and Oils Butter, stick margarine, lard, shortening, ghee, and bacon fat. Coconut, palm kernel, or palm oils. Regular salad dressings. Other Pickles and olives. Salted popcorn and pretzels. The items listed above may not be a complete list of foods and beverages to avoid. Contact your dietitian for more information. WHERE CAN I FIND MORE INFORMATION? National Heart, Lung, and Blood Institute: travelstabloid.com

## 2015-08-03 NOTE — Progress Notes (Signed)
Subjective: Eric Padilla is a 60 y.o. male here for hypertension follow up.   He notes no changes in medications, tolerating them well, takes them every morning. Attempts to adhere to low salt diet and increasing activity.   - ROS: Denies CP, SOB, palpitations, syncope, dizziness, orthopnea, PND, frequent headaches, vision changes, leg swelling. + stable claudication, known PVD. - [place]: Not smoking  Objective: BP 138/62 mmHg  Pulse 94  Temp(Src) 97.7 F (36.5 C) (Oral)  Ht 5\' 11"  (1.803 m)  Wt 162 lb 3.2 oz (73.573 kg)  BMI 22.63 kg/m2 Gen: Pleasant 59 y.o.male in no distress, smells of cigarette smoke Neck: brisk carotid upstroke, no bruits; thyroid not enlarged  Pulm: Non-labored breathing room air; CTAB CV: Regular rate with normal S1/S2, no murmur; no LE edema, no JVD; DP and radial pulses symmetric and 2+. Homan's sign absent. cap refill < 2 sec.  GI: Nontender, nondistended, no HSM     Chemistry      Component Value Date/Time   NA 136 06/27/2015 1351   K 4.1 06/27/2015 1351   CL 98 06/27/2015 1351   CO2 34* 06/27/2015 1351   BUN 22 06/27/2015 1351   CREATININE 1.14 06/27/2015 1351   CREATININE <0.47* 01/08/2011 0420      Component Value Date/Time   CALCIUM 9.8 06/27/2015 1351   ALKPHOS 106 12/22/2014 0923   AST 22 12/22/2014 0923   ALT 13 12/22/2014 0923   BILITOT 0.5 12/22/2014 [id number]     Assessment & Plan: Eric Padilla is a 60 y.o. male here for hypertension, currently controlled.   Essential hypertension At goal. Continue current medications. Reporst increased urination without LUTS consistent with HCTZ. After discussion, he opts to continue this in lieu of changing to norvasc as it took a while to get HTN under control. Cr and K checked and ok at last visit.

## 2015-08-07 ENCOUNTER — Other Ambulatory Visit: Payer: Self-pay | Admitting: Family Medicine

## 2015-08-30 ENCOUNTER — Other Ambulatory Visit: Payer: Self-pay | Admitting: Family Medicine

## 2015-08-31 ENCOUNTER — Other Ambulatory Visit: Payer: Self-pay | Admitting: Family Medicine

## 2015-09-04 ENCOUNTER — Other Ambulatory Visit: Payer: Self-pay | Admitting: Family Medicine

## 2015-11-06 ENCOUNTER — Other Ambulatory Visit: Payer: Self-pay | Admitting: Family Medicine

## 2015-11-06 ENCOUNTER — Telehealth: Payer: Self-pay | Admitting: Family Medicine

## 2015-11-06 NOTE — Telephone Encounter (Signed)
Pt is calling for a refill on his Losartan. Eric Padilla

## 2015-11-13 ENCOUNTER — Encounter: Payer: Self-pay | Admitting: Family Medicine

## 2015-11-13 ENCOUNTER — Ambulatory Visit (INDEPENDENT_AMBULATORY_CARE_PROVIDER_SITE_OTHER): Payer: Medicare Other | Admitting: Family Medicine

## 2015-11-13 VITALS — BP 136/60 | HR 77 | Temp 97.6°F | Ht 71.0 in | Wt 162.2 lb

## 2015-11-13 DIAGNOSIS — J449 Chronic obstructive pulmonary disease, unspecified: Secondary | ICD-10-CM

## 2015-11-13 DIAGNOSIS — F17200 Nicotine dependence, unspecified, uncomplicated: Secondary | ICD-10-CM | POA: Diagnosis not present

## 2015-11-13 DIAGNOSIS — D126 Benign neoplasm of colon, unspecified: Secondary | ICD-10-CM

## 2015-11-13 DIAGNOSIS — I739 Peripheral vascular disease, unspecified: Secondary | ICD-10-CM

## 2015-11-13 DIAGNOSIS — I10 Essential (primary) hypertension: Secondary | ICD-10-CM

## 2015-11-13 NOTE — Assessment & Plan Note (Signed)
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 Tolerating medications. Likely improved control is multifactorial including contribution from smoking cessation.  - Continue current regimen - Renal function ok at last check (Cr < 30% increase from prior to ARB initiation; 1.06 > 1.14) - Primary elements of DASH diet and weight loss strategies reviewed in detail. Handout given.  - Has been at goal x3 visits; follow up in 6 months for BP check or sooner as needed.

## 2015-11-13 NOTE — Patient Instructions (Signed)
We made no medication changes today:  - High blood pressure is best treated by losing weight, taking medications as directed, avoiding salt in your diet and increasing potassium from fruits and vegetables (called the DASH diet).  Thank you for letting me care for you,  - Dr. Bonner Puna  DASH Eating Plan DASH stands for "Dietary Approaches to Stop Hypertension." The DASH eating plan is a healthy eating plan that has been shown to reduce high blood pressure (hypertension). Additional health benefits may include reducing the risk of type 2 diabetes mellitus, heart disease, and stroke. The DASH eating plan may also help with weight loss. WHAT DO I NEED TO KNOW ABOUT THE DASH EATING PLAN? For the DASH eating plan, you will follow these general guidelines:  Choose foods with a percent daily value for sodium of less than 5% (as listed on the food label).  Use salt-free seasonings or herbs instead of table salt or sea salt.  Check with your health care provider or pharmacist before using salt substitutes.  Eat lower-sodium products, often labeled as "lower sodium" or "no salt added."  Eat fresh foods.  Eat more vegetables, fruits, and low-fat dairy products.  Choose whole grains. Look for the word "whole" as the first word in the ingredient list.  Choose fish and skinless chicken or [place] more often than red meat. Limit fish, poultry, and meat to 6 oz (170 g) each day.  Limit sweets, desserts, sugars, and sugary drinks.  Choose heart-healthy fats.  Limit cheese to 1 oz (28 g) per day.  Eat more home-cooked food and less restaurant, buffet, and fast food.  Limit fried foods.  Cook foods using methods other than frying.  Limit canned vegetables. If you do use them, rinse them well to decrease the sodium.  When eating at a restaurant, ask that your food be prepared with less salt, or no salt if possible. WHAT FOODS CAN I EAT? Seek help from a dietitian for individual calorie  needs. Grains Whole grain or whole wheat bread. Brown rice. Whole grain or whole wheat pasta. Quinoa, bulgur, and whole grain cereals. Low-sodium cereals. Corn or whole wheat flour tortillas. Whole grain cornbread. Whole grain crackers. Low-sodium crackers. Vegetables Fresh or frozen vegetables (raw, steamed, roasted, or grilled). Low-sodium or reduced-sodium tomato and vegetable juices. Low-sodium or reduced-sodium tomato sauce and paste. Low-sodium or reduced-sodium canned vegetables.  Fruits All fresh, canned (in natural juice), or frozen fruits. Meat and Other Protein Products Ground beef (85% or leaner), grass-fed beef, or beef trimmed of fat. Skinless chicken or [place]. Ground chicken or [place]. Pork trimmed of fat. All fish and seafood. Eggs. Dried beans, peas, or lentils. Unsalted nuts and seeds. Unsalted canned beans. Dairy Low-fat dairy products, such as skim or 1% milk, 2% or reduced-fat cheeses, low-fat ricotta or cottage cheese, or plain low-fat yogurt. Low-sodium or reduced-sodium cheeses. Fats and Oils Tub margarines without trans fats. Light or reduced-fat mayonnaise and salad dressings (reduced sodium). Avocado. Safflower, olive, or canola oils. Natural peanut or almond butter. Other Unsalted popcorn and pretzels.  WHAT FOODS ARE NOT RECOMMENDED? Grains White bread. White pasta. White rice. Refined cornbread. Bagels and croissants. Crackers that contain trans fat. Vegetables Creamed or fried vegetables. Vegetables in a cheese sauce. Regular canned vegetables. Regular canned tomato sauce and paste. Regular tomato and vegetable juices. Fruits Dried fruits. Canned fruit in light or heavy syrup. Fruit juice. Meat and Other Protein Products Fatty cuts of meat. Ribs, chicken wings, bacon, sausage, bologna, salami, chitterlings,  fatback, hot dogs, bratwurst, and packaged luncheon meats. Salted nuts and seeds. Canned beans with salt. Dairy Whole or 2% milk, cream, half-and-half,  and cream cheese. Whole-fat or sweetened yogurt. Full-fat cheeses or blue cheese. Nondairy creamers and whipped toppings. Processed cheese, cheese spreads, or cheese curds. Condiments Onion and garlic salt, seasoned salt, table salt, and sea salt. Canned and packaged gravies. Worcestershire sauce. Tartar sauce. Barbecue sauce. Teriyaki sauce. Soy sauce, including reduced sodium. Steak sauce. Fish sauce. Oyster sauce. Cocktail sauce. Horseradish. Ketchup and mustard. Meat flavorings and tenderizers. Bouillon cubes. Hot sauce. Tabasco sauce. Marinades. Taco seasonings. Relishes. Fats and Oils Butter, stick margarine, lard, shortening, ghee, and bacon fat. Coconut, palm kernel, or palm oils. Regular salad dressings. Other Pickles and olives. Salted popcorn and pretzels. The items listed above may not be a complete list of foods and beverages to avoid. Contact your dietitian for more information. WHERE CAN I FIND MORE INFORMATION? [employer], [employer], [employer]: travelstabloid.com

## 2015-11-13 NOTE — Progress Notes (Signed)
Subjective: Eric Padilla is a 60 y.o. male with a history of tobacco use (quit 2016), COPD, HTN, PAD here for hypertension follow up.   Doesn't often check BP at home. Takes HCTZ 50mg , losartan 50mg , metoprolol 100mg  daily, misses no doses on average. Walks very frequently, easier to do now that he has stopped smoking. Adheres to low salt diet. Has learned to arise from seated/laying positions slowly, denies lightheadedness/syncope.   Takes lipitor 40mg  daily and aspirin 81mg . Denies myalgias.   - ROS: Denies CP, SOB, palpitations, dizziness, orthopnea, PND, frequent headaches, vision changes, change in claudication, leg swelling. - PMFSH: quit smoker, no EtOH, no illicit drugs. - Medications: reviewed and updated  Objective: BP 136/60 mmHg  Pulse 77  Temp(Src) 97.6 F (36.4 C) (Oral)  Ht 5\' 11"  (1.803 m)  Wt 162 lb 3.2 oz (73.573 kg)  BMI 22.63 kg/m2 Gen: Well-appearing 59 y.o.male in no distress Neck: thyroid not enlarged  Pulm: Non-labored breathing room air; CTAB CV: Regular rate with normal S1/S2, no murmur; no LE edema, no JVD; DP and radial pulses symmetric and 2+. Homan's sign absent. cap refill < 2 sec. GI: Nontender, nondistended, no HSM   Assessment & Plan: Eric Padilla is a 60 y.o. male here for hypertension, currently controlled.   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 Tolerating medications. Likely improved control is multifactorial including contribution from smoking cessation.  - Continue current regimen - Renal function ok at last check (Cr < 30% increase from prior to ARB initiation; 1.06 > 1.14) - Primary elements of DASH diet and weight loss strategies reviewed in detail. Handout given.  - Has been at goal x3 visits; follow up in 6 months for BP check or sooner as needed.   PAD (peripheral artery disease) Stable claudication symptoms. On ASA and lipitor.   Adenomatous polyp of colon Discussed over due for  f/u. Referral has been made. He understands indications for follow up and will call to schedule if he can get his ex-wife to take him.   COPD, moderate Denies any symptoms, able to walk 1.5 miles without dyspnea, not on controller medications.

## 2015-11-13 NOTE — Assessment & Plan Note (Signed)
Discussed over due for f/u. Referral has been made. He understands indications for follow up and will call to schedule if he can get his ex-wife to take him.

## 2015-11-13 NOTE — Assessment & Plan Note (Signed)
Denies any symptoms, able to walk 1.5 miles without dyspnea, not on controller medications.

## 2015-11-13 NOTE — Assessment & Plan Note (Addendum)
Stable claudication symptoms. On ASA and lipitor.

## 2015-12-01 ENCOUNTER — Other Ambulatory Visit: Payer: Self-pay | Admitting: Family Medicine

## 2016-03-05 ENCOUNTER — Encounter: Payer: Self-pay | Admitting: Family

## 2016-03-07 ENCOUNTER — Ambulatory Visit (HOSPITAL_COMMUNITY)
Admission: RE | Admit: 2016-03-07 | Discharge: 2016-03-07 | Disposition: A | Discharge status: Home / Self Care | Payer: Medicare Other | Source: Ambulatory Visit | Attending: Family | Admitting: Family

## 2016-03-07 ENCOUNTER — Ambulatory Visit (INDEPENDENT_AMBULATORY_CARE_PROVIDER_SITE_OTHER)
Admission: RE | Admit: 2016-03-07 | Discharge: 2016-03-07 | Disposition: A | Discharge status: Home / Self Care | Payer: Medicare Other | Source: Ambulatory Visit | Attending: Vascular Surgery | Admitting: Vascular Surgery

## 2016-03-07 ENCOUNTER — Encounter: Payer: Self-pay | Admitting: Family

## 2016-03-07 ENCOUNTER — Ambulatory Visit (INDEPENDENT_AMBULATORY_CARE_PROVIDER_SITE_OTHER): Payer: Medicare Other | Admitting: Family

## 2016-03-07 VITALS — BP 131/80 | HR 83 | Temp 97.6°F | Resp 18 | Ht 71.0 in | Wt 160.0 lb

## 2016-03-07 DIAGNOSIS — Z95828 Presence of other vascular implants and grafts: Secondary | ICD-10-CM

## 2016-03-07 DIAGNOSIS — Z87891 Personal history of nicotine dependence: Secondary | ICD-10-CM

## 2016-03-07 DIAGNOSIS — I739 Peripheral vascular disease, unspecified: Secondary | ICD-10-CM

## 2016-03-07 DIAGNOSIS — Z7722 Contact with and (suspected) exposure to environmental tobacco smoke (acute) (chronic): Secondary | ICD-10-CM

## 2016-03-07 DIAGNOSIS — I779 Disorder of arteries and arterioles, unspecified: Secondary | ICD-10-CM

## 2016-03-07 DIAGNOSIS — I6521 Occlusion and stenosis of right carotid artery: Secondary | ICD-10-CM | POA: Diagnosis not present

## 2016-03-07 DIAGNOSIS — R0989 Other specified symptoms and signs involving the circulatory and respiratory systems: Secondary | ICD-10-CM

## 2016-03-07 LAB — VAS US CAROTID
LCCADDIAS: 16 cm/s
LCCADSYS: 69 cm/s
LCCAPDIAS: 21 cm/s
LCCAPSYS: 96 cm/s
LEFT ECA DIAS: 14 cm/s
LICADDIAS: -28 cm/s
LICADSYS: -103 cm/s
LICAPDIAS: -17 cm/s
LICAPSYS: -75 cm/s
RCCAPSYS: 96 cm/s
RIGHT CCA MID DIAS: 14 cm/s
RIGHT ECA DIAS: -36 cm/s
Right CCA prox dias: 16 cm/s
Right cca dist sys: -94 cm/s

## 2016-03-07 NOTE — Progress Notes (Signed)
[facility] [place] HISTORY AND PHYSICAL   MRN : [id number]  History of Present Illness:   Eric Padilla is a 60 y.o. male patient of Dr. Oneida Alar who presents for follow-up evaluation for PAD. He is s/p right to left femoral-femoral bypass graft, right lower extremity femoral-popliteal artery bypass graft with vein and right popliteal endarterectomy performed 01/07/2011.  He is on aspirin for antiplatelet therapy. His atherosclerotic risk factors remain hypertension and former smoking. These are all currently stable and followed by his PCP.  He walks about 20 minutes before both calves claudicate, relieved by rest, he can then continue walking for 1-2 miles with no claudication symptoms. He walks about 60 minutes daily. He denies non healing wounds. He denies any history of stroke or TIA.   Pt Diabetic: No Pt smoker: former smoker (quit in August, 2016, started smoking in his early 20's, was up to 2 ppd). However, he is exposed in his home to secondhand smoke.  Pt meds include: Statin :Yes Betablocker: Yes ASA: Yes Other anticoagulants/antiplatelets: no   Current Outpatient Prescriptions  Medication Sig Dispense Refill  . aspirin EC 81 MG tablet Take 1 tablet (81 mg total) by mouth daily. 90 tablet 3  . atorvastatin (LIPITOR) 40 MG tablet Take 1 tablet by mouth daily 90 tablet 1  . hydrochlorothiazide (HYDRODIURIL) 50 MG tablet TAKE 1 TABLET BY MOUTH EVERY DAY 90 tablet 1  . losartan (COZAAR) 50 MG tablet TAKE 1 TABLET BY MOUTH EVERY DAY 90 tablet 0  . metoprolol succinate (TOPROL-XL) 100 MG 24 hr tablet Take 1 tablet by mouth daily 90 tablet 3   No current facility-administered medications for this visit.     Past Medical History:  Diagnosis Date  . Carotid artery occlusion   . Foot pain 11/22/2010   while lying flat  . Hyperlipidemia   . Hypertension   . Leg pain    with walking  . Peripheral arterial disease [facility])     Social  History Social History  Substance Use Topics  . Smoking status: Former Smoker    Packs/day: 0.15    Years: 20.00    Types: Cigarettes    Start date: 03/30/2014    Quit date: 01/27/2015  . Smokeless tobacco: Never Used  . Alcohol use 7.2 - 8.4 oz/week    12 - 14 Standard drinks or equivalent per week    Family History Family History  Problem Relation Age of Onset  . Stroke Mother   . Heart disease Father   . Breast cancer Sister     Surgical History Past Surgical History:  Procedure Laterality Date  . COLONOSCOPY  May 2015  . PR VEIN BYPASS GRAFT,AORTO-FEM-POP  01/07/11   (L)-(R) fem-fem; (R) fem-AK pop BP    No Known Allergies  Current Outpatient Prescriptions  Medication Sig Dispense Refill  . aspirin EC 81 MG tablet Take 1 tablet (81 mg total) by mouth daily. 90 tablet 3  . atorvastatin (LIPITOR) 40 MG tablet Take 1 tablet by mouth daily 90 tablet 1  . hydrochlorothiazide (HYDRODIURIL) 50 MG tablet TAKE 1 TABLET BY MOUTH EVERY DAY 90 tablet 1  . losartan (COZAAR) 50 MG tablet TAKE 1 TABLET BY MOUTH EVERY DAY 90 tablet 0  . metoprolol succinate (TOPROL-XL) 100 MG 24 hr tablet Take 1 tablet by mouth daily 90 tablet 3   No current facility-administered medications for this visit.      REVIEW OF SYSTEMS: See HPI for pertinent positives and  negatives.  Physical Examination Vitals:   03/07/16 1024 03/07/16 1027  BP: 120/78 131/80  Pulse: 83   Resp: 18   Temp: 97.6 F (36.4 C)   TempSrc: Oral   SpO2: 96%   Weight: 160 lb (72.6 kg)   Height: 5\' 11"  (1.803 m)    Body mass index is 22.32 kg/m.  General: A&O x 3, WDWN. Gait: normal Eyes: PERRLA. Pulmonary: Respirations are non labored, CTAB, without wheezes , rales or rhonchi. Cardiac: regular rhythm and rate, no detected murmur.     Carotid Bruits Right Left   positive Negative  Aorta is not palpable. Radial pulses: are 2+ palpable and = Fem-fem bypass graft pulse is  strongly palpable at pubis   VASCULAR EXAM: Extremities without ischemic changes  without Gangrene; without open wounds.      LE Pulses Right Left   FEMORAL  palpable  palpable    POPLITEAL not palpable  not palpable   POSTERIOR TIBIAL 2+ palpable  not palpable, monophasic waveforms by Doppler    DORSALIS PEDIS  ANTERIOR TIBIAL not palpable, biphasic waveforms by Doppler  not palpable, biphasic waveforms by Doppler    Abdomen: soft, NT, no palpable masses. Skin: no rashes, no ulcers. Musculoskeletal: no muscle wasting or atrophy. Neurologic: A&O X 3; Appropriate Affect ; SENSATION: normal; MOTOR FUNCTION: moving all extremities equally, motor strength 5/5 throughout. Speech is fluent/normal.  CN 2-12 intact.   ASSESSMENT:  Eric Padilla is a 60 y.o. male who is s/p right to left femoral-femoral bypass graft, right lower extremity femoral-popliteal artery bypass graft with vein and right popliteal endarterectomy performed 01/07/2011.  He has a right carotid bruit, no history of stroke or TIA.  He has mild bilateral calf claudication with walking and he walks 2-3 miles daily. His atherosclerotic risk factors include former smoker and current exposure to secondhand smoke in his home. Fortunately he does not have DM, he takes a daily statin and ASA.   DATA Today's right LE arterial duplex suggests a patent right femoral to popliteal artery bypass graft, and femoral to femoral bypass graft duplex, no hemodynamically significant stenosis. Partially occlusive plaque noted in the right distal external iliac artery (275 cm/s), possibly >50% stenosis. No internal vessel narrowing noted within the bypass graft or anastomosis.  Biphasic flow throughout a patent right to left  fem-fem bypass graft. No significant change compared to duplex exam on 02/23/15.  Both ABI's have declined since 02/23/15: right is 0.85 today, was 1.02, biphasic waveforms. Left is 0.58 today, was 0.74, bi and monophasic waveforms.  Today's carotid duplex suggests <40% bilateral ICA stenoses. Both vertebral arteries are antegrade, both subclavian arteries are multiphasic (normal).    PLAN:   Graduated walking program discussed and how to achieve.   Based on today's exam and non-invasive vascular lab results, the patient will follow up in 6 months with the following tests: ABI's and right LE arterial duplex, with attention to right EIA stenosis. Carotid duplex in 1-2 years.   I discussed in depth with the patient the nature of atherosclerosis, and emphasized the importance of maximal medical management including strict control of blood pressure, blood glucose, and lipid levels, obtaining regular exercise, and cessation of smoking.  The patient is aware that without maximal medical management the underlying atherosclerotic disease process will progress, limiting the benefit of any interventions.  The patient was given information about stroke prevention and what symptoms should prompt the patient to seek immediate medical care.  The patient was  given information about PAD including signs, symptoms, treatment, what symptoms should prompt the patient to seek immediate medical care, and risk reduction measures to take. Thank you for allowing [place] to participate in this patient's care.  Clemon Chambers, RN, MSN, FNP-C [facility] Office: [phone]  Clinic MD: Oneida Alar 03/07/2016 10:30 AM

## 2016-03-07 NOTE — Patient Instructions (Signed)
Peripheral Vascular Disease Peripheral vascular disease (PVD) is a disease of the blood vessels that are not part of your heart and brain. A simple term for PVD is poor circulation. In most cases, PVD narrows the blood vessels that carry blood from your heart to the rest of your body. This can result in a decreased supply of blood to your arms, legs, and internal organs, like your stomach or kidneys. However, it most often affects a person's lower legs and feet. There are two types of PVD.  Organic PVD. This is the more common type. It is caused by damage to the structure of blood vessels.  Functional PVD. This is caused by conditions that make blood vessels contract and tighten (spasm). Without treatment, PVD tends to get worse over time. PVD can also lead to acute ischemic limb. This is when an arm or limb suddenly has trouble getting enough blood. This is a medical emergency. CAUSES Each type of PVD has many different causes. The most common cause of PVD is buildup of a fatty material (plaque) inside of your arteries (atherosclerosis). Small amounts of plaque can break off from the walls of the blood vessels and become lodged in a smaller artery. This blocks blood flow and can cause acute ischemic limb. Other common causes of PVD include:  Blood clots that form inside of blood vessels.  Injuries to blood vessels.  Diseases that cause inflammation of blood vessels or cause blood vessel spasms.  Health behaviors and health history that increase your risk of developing PVD. RISK FACTORS  You may have a greater risk of PVD if you:  Have a family history of PVD.  Have certain medical conditions, including:  High cholesterol.  Diabetes.  High blood pressure (hypertension).  Coronary heart disease.  Past problems with blood clots.  Past injury, such as burns or a broken bone. These may have damaged blood vessels in your limbs.  Buerger disease. This is caused by inflamed blood  vessels in your hands and feet.  Some forms of arthritis.  Rare birth defects that affect the arteries in your legs.  Use tobacco.  Do not get enough exercise.  Are obese.  Are age 50 or older. SIGNS AND SYMPTOMS  PVD may cause many different symptoms. Your symptoms depend on what part of your body is not getting enough blood. Some common signs and symptoms include:  Cramps in your lower legs. This may be a symptom of poor leg circulation (claudication).  Pain and weakness in your legs while you are physically active that goes away when you rest (intermittent claudication).  Leg pain when at rest.  Leg numbness, tingling, or weakness.  Coldness in a leg or foot, especially when compared with the other leg.  Skin or hair changes. These can include:  Hair loss.  Shiny skin.  Pale or bluish skin.  Thick toenails.  Inability to get or maintain an erection (erectile dysfunction). People with PVD are more prone to developing ulcers and sores on their toes, feet, or legs. These may take longer than normal to heal. DIAGNOSIS Your health care provider may diagnose PVD from your signs and symptoms. The health care provider will also do a physical exam. You may have tests to find out what is causing your PVD and determine its severity. Tests may include:  Blood pressure recordings from your arms and legs and measurements of the strength of your pulses (pulse volume recordings).  Imaging studies using sound waves to take pictures of   the blood flow through your blood vessels (Doppler ultrasound).  Injecting a dye into your blood vessels before having imaging studies using:  X-rays (angiogram or arteriogram).  Computer-generated X-rays (CT angiogram).  A powerful electromagnetic field and a computer (magnetic resonance angiogram or MRA). TREATMENT Treatment for PVD depends on the cause of your condition and the severity of your symptoms. It also depends on your age. Underlying  causes need to be treated and controlled. These include long-lasting (chronic) conditions, such as diabetes, high cholesterol, and high blood pressure. You may need to first try making lifestyle changes and taking medicines. Surgery may be needed if these do not work. Lifestyle changes may include:  Quitting smoking.  Exercising regularly.  Following a low-fat, low-cholesterol diet. Medicines may include:  Blood thinners to prevent blood clots.  Medicines to improve blood flow.  Medicines to improve your blood cholesterol levels. Surgical procedures may include:  A procedure that uses an inflated balloon to open a blocked artery and improve blood flow (angioplasty).  A procedure to put in a tube (stent) to keep a blocked artery open (stent implant).  Surgery to reroute blood flow around a blocked artery (peripheral bypass surgery).  Surgery to remove dead tissue from an infected wound on the affected limb.  Amputation. This is surgical removal of the affected limb. This may be necessary in cases of acute ischemic limb that are not improved through medical or surgical treatments. HOME CARE INSTRUCTIONS  Take medicines only as directed by your health care provider.  Do not use any tobacco products, including cigarettes, chewing tobacco, or electronic cigarettes. If you need help quitting, ask your health care provider.  Lose weight if you are overweight, and maintain a healthy weight as directed by your health care provider.  Eat a diet that is low in fat and cholesterol. If you need help, ask your health care provider.  Exercise regularly. Ask your health care provider to suggest some good activities for you.  Use compression stockings or other mechanical devices as directed by your health care provider.  Take good care of your feet.  Wear comfortable shoes that fit well.  Check your feet often for any cuts or sores. SEEK MEDICAL CARE IF:  You have cramps in your legs  while walking.  You have leg pain when you are at rest.  You have coldness in a leg or foot.  Your skin changes.  You have erectile dysfunction.  You have cuts or sores on your feet that are not healing. SEEK IMMEDIATE MEDICAL CARE IF:  Your arm or leg turns cold and blue.  Your arms or legs become red, warm, swollen, painful, or numb.  You have chest pain or trouble breathing.  You suddenly have weakness in your face, arm, or leg.  You become very confused or lose the ability to speak.  You suddenly have a very bad headache or lose your vision.   This information is not intended to replace advice given to you by your health care provider. Make sure you discuss any questions you have with your health care provider.   Document Released: 06/27/2004 Document Revised: 06/10/2014 Document Reviewed: 10/28/2013 Elsevier Interactive Patient Education 2016 [employer]       Secondhand Smoke WHAT IS SECONDHAND SMOKE? Secondhand smoke is smoke that comes from burning tobacco. It could be the smoke from a cigarette, a pipe, or a cigar. Even if you are not the one smoking, secondhand smoke exposes you to the dangers of   smoking. This is called involuntary, or passive, smoking. There are two types of secondhand smoke:  Sidestream smoke is the smoke that comes off the lighted end of a cigarette, pipe, or cigar.  This type of smoke has the highest amount of cancer-causing agents (carcinogens).  The particles in sidestream smoke are smaller. They get into your lungs more easily.  Mainstream smoke is the smoke that is exhaled by a person who is smoking.  This type of smoke is also dangerous to your health. HOW CAN SECONDHAND SMOKE AFFECT MY HEALTH? Studies show that there is no safe level of secondhand smoke. This smoke contains thousands of chemicals. At least 69 of them are known to cause cancer. Secondhand smoke can also cause many other health problems. It has been linked  to:  Lung cancer.  Cancer of the voice box (larynx) or throat.  Cancer of the sinuses.  Brain cancer.  Bladder cancer.  Stomach cancer.  Breast cancer.  White blood cell cancers (lymphoma and leukemia).  Brain and liver tumors in children.  Heart disease and stroke in adults.  Pregnancy loss (miscarriage).  Diseases in children, such as:  Asthma.  Lung infections.  Ear infections.  Sudden infant death syndrome (SIDS).  Slow growth. WHERE CAN I BE AT RISK FOR EXPOSURE TO SECONDHAND SMOKE?   For adults, the workplace is the main source of exposure to secondhand smoke.  Your workplace should have a policy separating smoking areas from nonsmoking areas.  Smoking areas should have a system for ventilating and cleaning the air.  For children, the home may be the most dangerous place for exposure to secondhand smoke.  Children who live in apartment buildings may be at risk from smoke drifting from hallways or other people's homes.  For everyone, many public places are possible sources of exposure to secondhand smoke.  These places include restaurants, shopping centers, and parks. HOW CAN I REDUCE MY RISK FOR EXPOSURE TO SECONDHAND SMOKE? The most important thing you can do is not smoke. Discourage family members from smoking. Other ways to reduce exposure for you and your family include the following:  Keep your home smoke free.  Make sure your child care providers do not smoke.  Warn your child about the dangers of smoking and secondhand smoke.  Do not allow smoking in your car. When someone smokes in a car, all the damaging chemicals from the smoke are confined in a small area.  Avoid public places where smoking is allowed.   This information is not intended to replace advice given to you by your health care provider. Make sure you discuss any questions you have with your health care provider.   Document Released: 06/27/2004 Document Revised: 06/10/2014  Document Reviewed: 09/03/2013 Elsevier Interactive Patient Education 2016 Elsevier Inc.  

## 2016-03-08 NOTE — Addendum Note (Signed)
Addended by: Kaleen Mask on: 03/08/2016 04:55 PM   Modules accepted: Orders

## 2016-04-15 ENCOUNTER — Telehealth: Payer: Self-pay | Admitting: Family Medicine

## 2016-04-15 MED ORDER — ATORVASTATIN CALCIUM 40 MG PO TABS: 40.0000 mg | ORAL_TABLET | Freq: Every day | ORAL | 3 refills | Status: AC

## 2016-04-15 NOTE — Telephone Encounter (Signed)
Pt needs a refill on lipitor. [facility] uses [facility]. Please advise. Thanks! ep

## 2016-04-15 NOTE — Telephone Encounter (Signed)
Done

## 2016-06-10 ENCOUNTER — Other Ambulatory Visit: Payer: Self-pay | Admitting: *Deleted

## 2016-06-10 MED ORDER — HYDROCHLOROTHIAZIDE 50 MG PO TABS: 50.0000 mg | ORAL_TABLET | Freq: Every day | ORAL | 0 refills | Status: DC

## 2016-06-10 NOTE — Telephone Encounter (Signed)
Refill of HCTZ given. Please schedule appointment to be seen prior to next refill.

## 2016-06-13 NOTE — Telephone Encounter (Signed)
Left voicemail for patient to call office back. Will try calling again. If patient calls back please see previous note and schedule appointment. Nat Christen, CMA

## 2016-06-14 NOTE — Telephone Encounter (Signed)
Patient has an appt on 06/18/16. Nat Christen, CMA

## 2016-06-18 ENCOUNTER — Encounter: Payer: Self-pay | Admitting: Family Medicine

## 2016-06-18 ENCOUNTER — Ambulatory Visit (INDEPENDENT_AMBULATORY_CARE_PROVIDER_SITE_OTHER): Payer: Medicare Other | Admitting: Family Medicine

## 2016-06-18 ENCOUNTER — Other Ambulatory Visit: Payer: Self-pay | Admitting: Family Medicine

## 2016-06-18 VITALS — BP 110/60 | HR 92 | Temp 98.9°F | Ht 71.0 in | Wt 145.6 lb

## 2016-06-18 DIAGNOSIS — R69 Illness, unspecified: Secondary | ICD-10-CM | POA: Diagnosis not present

## 2016-06-18 DIAGNOSIS — Z1211 Encounter for screening for malignant neoplasm of colon: Secondary | ICD-10-CM

## 2016-06-18 DIAGNOSIS — I10 Essential (primary) hypertension: Secondary | ICD-10-CM

## 2016-06-18 DIAGNOSIS — Z23 Encounter for immunization: Secondary | ICD-10-CM | POA: Diagnosis not present

## 2016-06-18 DIAGNOSIS — F17200 Nicotine dependence, unspecified, uncomplicated: Secondary | ICD-10-CM

## 2016-06-18 DIAGNOSIS — D126 Benign neoplasm of colon, unspecified: Secondary | ICD-10-CM

## 2016-06-18 DIAGNOSIS — E785 Hyperlipidemia, unspecified: Secondary | ICD-10-CM | POA: Diagnosis not present

## 2016-06-18 DIAGNOSIS — I739 Peripheral vascular disease, unspecified: Secondary | ICD-10-CM

## 2016-06-18 MED ORDER — METOPROLOL SUCCINATE ER 100 MG PO TB24: 100.0000 mg | ORAL_TABLET | Freq: Every day | ORAL | 3 refills | Status: AC

## 2016-06-18 NOTE — Assessment & Plan Note (Signed)
Currently taking aspirin 81 mg as well as atorvastatin. Will check lipid panel today.

## 2016-06-18 NOTE — Patient Instructions (Signed)
Thank you so much for coming to visit today! We will check some lab work today! I have refilled your Metoprolol. I have placed a referral for Colonoscopy. Please call the numbers given to see if there is a volunteer who may wait with you.  Dr. Gerlean Ren

## 2016-06-18 NOTE — Assessment & Plan Note (Signed)
Quit 6 months ago.

## 2016-06-18 NOTE — Progress Notes (Signed)
Subjective:     Patient ID: Eric Padilla, male   DOB: 1956-04-16, 61 y.o.   MRN: [id number]  HPI Mr. Shy is a 61 year old male presenting today for follow-up of hypertension. Reports blood pressure is well controlled. Continues to take hydrochlorothiazide, losartan, and metoprolol. Denies chest pain, shortness of breath, weakness, numbness, blurred vision. Last CMP in January 2017. Quit smoking 6 months ago. Currently due for colonoscopy. Reports he doesn't have any family or friends that will stay with him to have this done, which has been a limiting factor for him in the past. Amenable to colonoscopy if we can find resources so he can have this done. Denies blood in his stool or dark stools.  Review of Systems Per HPI    Objective:   Physical Exam  Constitutional: He appears well-developed and well-nourished. No distress.  HENT:  Head: Normocephalic and atraumatic.  Left and right tympanic membrane normal  Cardiovascular: Normal rate and regular rhythm.   No murmur heard. Pulmonary/Chest: Effort normal. No respiratory distress. He has no wheezes.  Abdominal: He exhibits no distension. There is no tenderness.  Skin: No rash noted.  Psychiatric: He has a normal mood and affect. His behavior is normal.      Assessment and Plan:     Essential hypertension Continue HCTZ, losartan, metoprolol. Metoprolol refilled. Will obtain CMP. Return in one year or sooner if needed.  Tobacco use disorder Quit 6 months ago.  PAD (peripheral artery disease) Currently taking aspirin 81 mg as well as atorvastatin. Will check lipid panel today.  Adenomatous polyp of colon Last colonoscopy in 2015 with 2 tubular adenomas in 1 tubulovillous adenoma; recommended repeat in one year. Reports he has been unable to have this done since he has no friends or family that will wait with him as acquired. Referral to GI for colonoscopy placed, resources from social work given-per social work these resources  occasionally provide volunteers to wait with the patient, however they do not always have volunteers available to do so.

## 2016-06-18 NOTE — Assessment & Plan Note (Signed)
Continue HCTZ, losartan, metoprolol. Metoprolol refilled. Will obtain CMP. Return in one year or sooner if needed.

## 2016-06-18 NOTE — Assessment & Plan Note (Signed)
Last colonoscopy in 2015 with 2 tubular adenomas in 1 tubulovillous adenoma; recommended repeat in one year. Reports he has been unable to have this done since he has no friends or family that will wait with him as acquired. Referral to GI for colonoscopy placed, resources from social work given-per social work these resources occasionally provide volunteers to wait with the patient, however they do not always have volunteers available to do so.

## 2016-07-01 ENCOUNTER — Telehealth: Payer: Self-pay | Admitting: Family Medicine

## 2016-07-01 NOTE — Telephone Encounter (Signed)
Pt scheduled for 07/04/16. Ottis Stain, CMA

## 2016-07-01 NOTE — Telephone Encounter (Signed)
Please have Eric Padilla schedule an appointment to discuss his lab work.

## 2016-07-05 ENCOUNTER — Ambulatory Visit (INDEPENDENT_AMBULATORY_CARE_PROVIDER_SITE_OTHER): Payer: Medicare Other | Admitting: Family Medicine

## 2016-07-05 ENCOUNTER — Encounter: Payer: Self-pay | Admitting: Family Medicine

## 2016-07-05 VITALS — BP 108/60 | HR 100 | Temp 97.4°F | Ht 71.0 in | Wt 141.4 lb

## 2016-07-05 DIAGNOSIS — R748 Abnormal levels of other serum enzymes: Secondary | ICD-10-CM | POA: Diagnosis not present

## 2016-07-05 DIAGNOSIS — R7989 Other specified abnormal findings of blood chemistry: Secondary | ICD-10-CM | POA: Diagnosis not present

## 2016-07-05 NOTE — Patient Instructions (Signed)
Thank you so much for coming back to talk about your labs! Your kidney function was abnormal. We will recheck this. If abnormal again, we may need to adjust your medications and refer you to a kidney doctor. Your Alkaline Phosphatase was elevated. This can be elevated with liver or bone disease. The other liver enzymes were normal. We will recheck this and another lab to tell us if it is due to the liver or bone.  Dr. Gerlean Ren

## 2016-07-05 NOTE — Progress Notes (Signed)
Subjective:     Patient ID: Eric Padilla, male   DOB: 06/29/1955, 61 y.o.   MRN: [id number]  HPI Eric Padilla is a 61yo male presenting today to discuss lab results. Labs were obtained 06/18/16. Creatinine was elevated to 2.18 (previous 1.14), BUN 29, GFR 36. Alkaline Phosphatase elevated at 162, other liver enzymes and bilirubin normal. Amenable to repeating labs and continuing workup of lab abnormalities. Urine output unchanged. Denies any acute concerns. Former Smoker.  Review of Systems Per HPI    Objective:   Physical Exam  Constitutional: He appears well-developed and well-nourished. No distress.  HENT:  Head: Normocephalic and atraumatic.  Cardiovascular: Normal rate and regular rhythm.   No murmur heard. Pulmonary/Chest: Effort normal. No respiratory distress. He has no wheezes.  Psychiatric: He has a normal mood and affect. His behavior is normal.      Assessment and Plan:     1. Alkaline phosphatase elevation Will repeat CMP today. Will check GGT to determine bone vs liver etiology. Continue workup pending results.  2. Creatinine elevation Repeat CMP. If still elevated, will refer to Nephrology. Follow up pending lab results.

## 2016-07-08 ENCOUNTER — Telehealth: Payer: Self-pay | Admitting: Family Medicine

## 2016-07-08 ENCOUNTER — Encounter: Payer: Self-pay | Admitting: Family Medicine

## 2016-07-08 DIAGNOSIS — N182 Chronic kidney disease, stage 2 (mild): Secondary | ICD-10-CM

## 2016-07-08 NOTE — Telephone Encounter (Signed)
Contacted concerning lab results. 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 Referral to Nephrology placed. Alkaline Phosphatase remains elevated to 181. GGT normal at 22. Has been losing weight without trying to, with weight of 160pounds noted in 03/2016, 141 pounds noted 07/05/16. Notes trouble starting his urination and feeling of incomplete emptying. Notes occasional blood in urine. Abdomen "feels weird". Was unable to go for colonoscopy--still with no one to wait with him while procedure is done. History of smoking noted, quit 34months ago. Appointment schedule for 07/18/16 at 10am. Will continue to discuss weight loss at that visit and arrange for lab work and/or imaging to be done.  Dr. Gerlean Ren

## 2016-07-15 ENCOUNTER — Encounter: Payer: Self-pay | Admitting: Family Medicine

## 2016-07-18 ENCOUNTER — Encounter: Payer: Self-pay | Admitting: Family Medicine

## 2016-07-18 ENCOUNTER — Ambulatory Visit (INDEPENDENT_AMBULATORY_CARE_PROVIDER_SITE_OTHER): Payer: Medicare Other | Admitting: Family Medicine

## 2016-07-18 VITALS — BP 112/60 | HR 102 | Temp 97.4°F | Ht 71.0 in | Wt 139.2 lb

## 2016-07-18 DIAGNOSIS — R634 Abnormal weight loss: Secondary | ICD-10-CM | POA: Diagnosis not present

## 2016-07-18 LAB — POCT URINALYSIS DIPSTICK
Glucose, UA: NEGATIVE
Ketones, UA: NEGATIVE
NITRITE UA: NEGATIVE
Protein, UA: >=300
Spec Grav, UA: 1.025
UROBILINOGEN UA: 1
pH, UA: 6.5

## 2016-07-18 LAB — POCT UA - MICROSCOPIC ONLY

## 2016-07-18 LAB — HEMOCCULT GUIAC POC 1CARD (OFFICE): Fecal Occult Blood, POC: NEGATIVE

## 2016-07-18 NOTE — Patient Instructions (Signed)
Thank you so much for returning! Hopeful we can find some answers soon! We will obtain some further lab tests today to check your prostate. If the ESR is elevated above 100, we may consider obtaining a CT scan of your lungs and abdomen. Please go for colonoscopy. Follow up pending labwork.  Dr. Gerlean Ren

## 2016-07-19 ENCOUNTER — Encounter: Payer: Self-pay | Admitting: Gastroenterology

## 2016-07-20 MED ORDER — CEPHALEXIN 500 MG PO CAPS: 500.0000 mg | ORAL_CAPSULE | Freq: Three times a day (TID) | ORAL | 0 refills | Status: DC

## 2016-07-20 NOTE — Progress Notes (Signed)
Subjective:     Patient ID: Eric Padilla, male   DOB: 1956/04/25, 61 y.o.   MRN: [id number]  HPI Eric Padilla is a 61yo male returning to further discuss lab results and weight loss.  Recheck of CMP shows elevated creatinine to 1.92; referral placed to Nephrology and he is scheduled to follow up with them on August 07, 2016. Alkaline phosphatase also elevated to 181 with normal AST, ALT, and bilirubin. GGT normal. Calcium minimally elevated to 10.4 (upper limit of normal 10.2), with correction for albumin bringing calcium level to 10.2.   Patient states he has been noticing decrease in weight, which is unintentional. Weight of 160lb noted in 03/2016, with weight of 139lb today. Notes urinary problems, including problems initiating stream, incomplete emptying, occasional blood in urine (improving, worse one month ago) abdomen feels sore (believes it is secondary to straining to get his urine out). History of smoking, but quit 34month ago. Denies shortness of breath and wheezing. History of colonoscopy in 2015 with 2 tubular adenomas and 1 tubulovillous adenoma; recommended repeat in one year. Limiting factor to have colonoscopy repeated is no friends or family to wait for him at the Gastroenterologist while procedure is done; states he has called his ex-wife, who has agreed to stay with him for the procedure given the urgency. Denies constipation or blood in stool. Stool is brown. Denies fever, dysuria, increased frequency. History of alcohol abuse, drinking 3-4 mixed drinks per night.  Review of Systems Per HPI    Objective:   Physical Exam  Constitutional: He appears well-developed and well-nourished. No distress.  HENT:  Head: Normocephalic.  Cardiovascular: Normal rate and regular rhythm.   No murmur heard. Pulmonary/Chest: Effort normal. No respiratory distress. He has no wheezes.  Abdominal: Soft. Bowel sounds are normal. He exhibits no distension and no mass.  Abdomen diffusely tender, negative  rebound, negative Rovsing, negative Murphy  Genitourinary:  Genitourinary Comments: Prostate diffusely enlarged  Lymphadenopathy:    He has no cervical adenopathy.  Skin: No rash noted.  Psychiatric: He has a normal mood and affect. His behavior is normal.      Assessment and Plan:     1. Loss of weight Concerning for bony metastasis vs. Malignancy with elevated Alkaline phosphatase and weight loss. Prostate diffusely enlarged with urinary symptoms. Will obtain PSA. Urinalysis with small leukocytes, >300 protein, large RBC; Microscopy with trace bacteria, too many to count RBC, 5-10 WBC. Keflex for treatment of possible UTI given urinalysis results and urinary symptoms.   FOBT negative. Encouraged to contact GI for colonoscopy now that he has someone to go with him. Will also obtain ESR. If greater than 100, plan for CT chest, abdomen, pelvis.  Precepted with Dr. CErin Hearing

## 2016-07-22 ENCOUNTER — Telehealth: Payer: Self-pay | Admitting: Family Medicine

## 2016-07-22 NOTE — Telephone Encounter (Signed)
Keflex canceled. Pharmacy contacted.

## 2016-07-29 ENCOUNTER — Encounter: Payer: Self-pay | Admitting: Family Medicine

## 2016-08-01 LAB — PSA: Prostate Specific Ag, Serum: 1.6 ng/mL (ref 0.0–4.0)

## 2016-08-01 LAB — COMPREHENSIVE METABOLIC PANEL
ALBUMIN: 4.3 g/dL (ref 3.6–4.8)
ALT: 5 IU/L (ref 0–44)
ALT: 7 IU/L (ref 0–44)
AST: 11 IU/L (ref 0–40)
AST: 14 IU/L (ref 0–40)
Albumin/Globulin Ratio: 1.3 (ref 1.2–2.2)
Albumin/Globulin Ratio: 1.4 (ref 1.2–2.2)
Albumin: 4.3 g/dL (ref 3.6–4.8)
Alkaline Phosphatase: 162 IU/L — ABNORMAL HIGH (ref 39–117)
Alkaline Phosphatase: 181 IU/L — ABNORMAL HIGH (ref 39–117)
BUN / CREAT RATIO: 14 (ref 10–24)
BUN/Creatinine Ratio: 13 (ref 10–24)
BUN: 26 mg/dL (ref 8–27)
BUN: 29 mg/dL — ABNORMAL HIGH (ref 8–27)
Bilirubin Total: 0.5 mg/dL (ref 0.0–1.2)
Bilirubin Total: 0.6 mg/dL (ref 0.0–1.2)
CALCIUM: 10.2 mg/dL (ref 8.6–10.2)
CALCIUM: 10.4 mg/dL — AB (ref 8.6–10.2)
CO2: 22 mmol/L (ref 18–29)
CO2: 25 mmol/L (ref 18–29)
CREATININE: 1.92 mg/dL — AB (ref 0.76–1.27)
Chloride: 88 mmol/L — ABNORMAL LOW (ref 96–106)
Chloride: 92 mmol/L — ABNORMAL LOW (ref 96–106)
Creatinine, Ser: 2.18 mg/dL — ABNORMAL HIGH (ref 0.76–1.27)
GFR calc Af Amer: 36 mL/min/{1.73_m2} — ABNORMAL LOW (ref >59)
GFR calc non Af Amer: 36 mL/min/{1.73_m2} — ABNORMAL LOW (ref >59)
GFR, EST AFRICAN AMERICAN: 42 mL/min/{1.73_m2} — AB (ref >59)
GFR, EST NON AFRICAN AMERICAN: 31 mL/min/{1.73_m2} — AB (ref >59)
GLUCOSE: 110 mg/dL — AB (ref 65–99)
GLUCOSE: 112 mg/dL — AB (ref 65–99)
Globulin, Total: 3.1 g/dL (ref 1.5–4.5)
Globulin, Total: 3.2 g/dL (ref 1.5–4.5)
Potassium: 4.5 mmol/L (ref 3.5–5.2)
Potassium: 4.9 mmol/L (ref 3.5–5.2)
Sodium: 130 mmol/L — ABNORMAL LOW (ref 134–144)
Sodium: 132 mmol/L — ABNORMAL LOW (ref 134–144)
TOTAL PROTEIN: 7.4 g/dL (ref 6.0–8.5)
Total Protein: 7.5 g/dL (ref 6.0–8.5)

## 2016-08-01 LAB — CBC WITH DIFFERENTIAL/PLATELET
BASOS: 0 %
Basophils Absolute: 0.1 10*3/uL (ref 0.0–0.2)
EOS (ABSOLUTE): 0.1 10*3/uL (ref 0.0–0.4)
Eos: 1 %
Hematocrit: 38.5 % (ref 37.5–51.0)
Hemoglobin: 13.1 g/dL (ref 13.0–17.7)
Immature Grans (Abs): 0 10*3/uL (ref 0.0–0.1)
Immature Granulocytes: 0 %
LYMPHS ABS: 1.2 10*3/uL (ref 0.7–3.1)
Lymphs: 9 %
MCH: 32 pg (ref 26.6–33.0)
MCHC: 34 g/dL (ref 31.5–35.7)
MCV: 94 fL (ref 79–97)
MONOS ABS: 1 10*3/uL — AB (ref 0.1–0.9)
Monocytes: 8 %
NEUTROS ABS: 10.8 10*3/uL — AB (ref 1.4–7.0)
Neutrophils: 82 %
Platelets: 460 10*3/uL — ABNORMAL HIGH (ref 150–379)
RBC: 4.1 x10E6/uL — ABNORMAL LOW (ref 4.14–5.80)
RDW: 13.4 % (ref 12.3–15.4)
WBC: 13.2 10*3/uL — ABNORMAL HIGH (ref 3.4–10.8)

## 2016-08-01 LAB — LIPID PANEL
CHOL/HDL RATIO: 2.7 ratio (ref 0.0–5.0)
Cholesterol, Total: 139 mg/dL (ref 100–199)
HDL: 51 mg/dL (ref >39)
LDL CALC: 62 mg/dL (ref 0–99)
TRIGLYCERIDES: 128 mg/dL (ref 0–149)
VLDL Cholesterol Cal: 26 mg/dL (ref 5–40)

## 2016-08-01 LAB — SEDIMENTATION RATE: Sed Rate: 7 mm/hr (ref 0–30)

## 2016-08-01 LAB — GAMMA GT: GGT: 22 IU/L (ref 0–65)

## 2016-08-02 ENCOUNTER — Telehealth: Payer: Self-pay | Admitting: Family Medicine

## 2016-08-02 NOTE — Telephone Encounter (Signed)
Attempted to contact to follow up status of Renal and GI referrals without response. Will continue to attempt contact.

## 2016-08-07 ENCOUNTER — Emergency Department (HOSPITAL_COMMUNITY): Payer: Medicare Other

## 2016-08-07 ENCOUNTER — Encounter (HOSPITAL_COMMUNITY): Payer: Self-pay | Admitting: Respiratory Therapy

## 2016-08-07 ENCOUNTER — Inpatient Hospital Stay (HOSPITAL_COMMUNITY)
Admission: EM | Admit: 2016-08-07 | Discharge: 2016-08-11 | DRG: 699 | Disposition: A | Discharge status: Home / Self Care | Payer: Medicare Other | Attending: Family Medicine | Admitting: Family Medicine

## 2016-08-07 DIAGNOSIS — I129 Hypertensive chronic kidney disease with stage 1 through stage 4 chronic kidney disease, or unspecified chronic kidney disease: Secondary | ICD-10-CM | POA: Diagnosis present

## 2016-08-07 DIAGNOSIS — E876 Hypokalemia: Secondary | ICD-10-CM | POA: Diagnosis not present

## 2016-08-07 DIAGNOSIS — N3289 Other specified disorders of bladder: Principal | ICD-10-CM

## 2016-08-07 DIAGNOSIS — F1010 Alcohol abuse, uncomplicated: Secondary | ICD-10-CM | POA: Diagnosis present

## 2016-08-07 DIAGNOSIS — Q6211 Congenital occlusion of ureteropelvic junction: Secondary | ICD-10-CM

## 2016-08-07 DIAGNOSIS — R350 Frequency of micturition: Secondary | ICD-10-CM

## 2016-08-07 DIAGNOSIS — E43 Unspecified severe protein-calorie malnutrition: Secondary | ICD-10-CM | POA: Insufficient documentation

## 2016-08-07 DIAGNOSIS — N401 Enlarged prostate with lower urinary tract symptoms: Secondary | ICD-10-CM

## 2016-08-07 DIAGNOSIS — Z79899 Other long term (current) drug therapy: Secondary | ICD-10-CM

## 2016-08-07 DIAGNOSIS — Z681 Body mass index (BMI) 19 or less, adult: Secondary | ICD-10-CM

## 2016-08-07 DIAGNOSIS — N184 Chronic kidney disease, stage 4 (severe): Secondary | ICD-10-CM | POA: Diagnosis present

## 2016-08-07 DIAGNOSIS — E872 Acidosis: Secondary | ICD-10-CM | POA: Diagnosis present

## 2016-08-07 DIAGNOSIS — Z7982 Long term (current) use of aspirin: Secondary | ICD-10-CM

## 2016-08-07 DIAGNOSIS — J439 Emphysema, unspecified: Secondary | ICD-10-CM | POA: Diagnosis present

## 2016-08-07 DIAGNOSIS — Z87891 Personal history of nicotine dependence: Secondary | ICD-10-CM

## 2016-08-07 DIAGNOSIS — N3001 Acute cystitis with hematuria: Secondary | ICD-10-CM

## 2016-08-07 DIAGNOSIS — N1339 Other hydronephrosis: Secondary | ICD-10-CM

## 2016-08-07 DIAGNOSIS — N179 Acute kidney failure, unspecified: Secondary | ICD-10-CM

## 2016-08-07 DIAGNOSIS — N1330 Unspecified hydronephrosis: Secondary | ICD-10-CM

## 2016-08-07 DIAGNOSIS — N131 Hydronephrosis with ureteral stricture, not elsewhere classified: Secondary | ICD-10-CM | POA: Diagnosis present

## 2016-08-07 DIAGNOSIS — E785 Hyperlipidemia, unspecified: Secondary | ICD-10-CM | POA: Diagnosis present

## 2016-08-07 DIAGNOSIS — R64 Cachexia: Secondary | ICD-10-CM | POA: Diagnosis present

## 2016-08-07 DIAGNOSIS — R338 Other retention of urine: Secondary | ICD-10-CM

## 2016-08-07 DIAGNOSIS — I739 Peripheral vascular disease, unspecified: Secondary | ICD-10-CM | POA: Diagnosis present

## 2016-08-07 LAB — COMPREHENSIVE METABOLIC PANEL
ALBUMIN: 3.2 g/dL — AB (ref 3.5–5.0)
ALT: 11 U/L — ABNORMAL LOW (ref 17–63)
ANION GAP: 13 (ref 5–15)
AST: 18 U/L (ref 15–41)
Alkaline Phosphatase: 143 U/L — ABNORMAL HIGH (ref 38–126)
BUN: 33 mg/dL — ABNORMAL HIGH (ref 6–20)
CHLORIDE: 94 mmol/L — AB (ref 101–111)
CO2: 26 mmol/L (ref 22–32)
Calcium: 9.8 mg/dL (ref 8.9–10.3)
Creatinine, Ser: 2.38 mg/dL — ABNORMAL HIGH (ref 0.61–1.24)
GFR calc Af Amer: 32 mL/min — ABNORMAL LOW (ref >60)
GFR calc non Af Amer: 28 mL/min — ABNORMAL LOW (ref >60)
GLUCOSE: 127 mg/dL — AB (ref 65–99)
POTASSIUM: 2.9 mmol/L — AB (ref 3.5–5.1)
SODIUM: 133 mmol/L — AB (ref 135–145)
Total Bilirubin: 0.8 mg/dL (ref 0.3–1.2)
Total Protein: 6.9 g/dL (ref 6.5–8.1)

## 2016-08-07 LAB — CBC WITH DIFFERENTIAL/PLATELET
BASOS PCT: 0 %
Basophils Absolute: 0 10*3/uL (ref 0.0–0.1)
EOS ABS: 0.1 10*3/uL (ref 0.0–0.7)
EOS PCT: 0 %
HCT: 37.6 % — ABNORMAL LOW (ref 39.0–52.0)
HEMOGLOBIN: 13.4 g/dL (ref 13.0–17.0)
Lymphocytes Relative: 7 %
Lymphs Abs: 1.2 10*3/uL (ref 0.7–4.0)
MCH: 32.4 pg (ref 26.0–34.0)
MCHC: 35.6 g/dL (ref 30.0–36.0)
MCV: 90.8 fL (ref 78.0–100.0)
Monocytes Absolute: 1.2 10*3/uL — ABNORMAL HIGH (ref 0.1–1.0)
Monocytes Relative: 7 %
Neutro Abs: 14.5 10*3/uL — ABNORMAL HIGH (ref 1.7–7.7)
Neutrophils Relative %: 86 %
PLATELETS: 448 10*3/uL — AB (ref 150–400)
RBC: 4.14 MIL/uL — AB (ref 4.22–5.81)
RDW: 13.4 % (ref 11.5–15.5)
WBC: 17 10*3/uL — AB (ref 4.0–10.5)

## 2016-08-07 LAB — I-STAT CG4 LACTIC ACID, ED
LACTIC ACID, VENOUS: 2.97 mmol/L — AB (ref 0.5–1.9)
Lactic Acid, Venous: 1.55 mmol/L (ref 0.5–1.9)

## 2016-08-07 LAB — URINALYSIS, ROUTINE W REFLEX MICROSCOPIC
BILIRUBIN URINE: NEGATIVE
Glucose, UA: NEGATIVE mg/dL
KETONES UR: 5 mg/dL — AB
NITRITE: NEGATIVE
Protein, ur: 100 mg/dL — AB
SQUAMOUS EPITHELIAL / LPF: NONE SEEN
Specific Gravity, Urine: 1.013 (ref 1.005–1.030)
pH: 6 (ref 5.0–8.0)

## 2016-08-07 LAB — LIPASE, BLOOD: LIPASE: 22 U/L (ref 11–51)

## 2016-08-07 MED ORDER — SODIUM CHLORIDE 0.9 % IV BOLUS (SEPSIS)
1000.0000 mL | Freq: Once | INTRAVENOUS | Status: AC
  Administered 2016-08-07: 1000 mL via INTRAVENOUS

## 2016-08-07 MED ORDER — POTASSIUM CHLORIDE CRYS ER 20 MEQ PO TBCR
40.0000 meq | EXTENDED_RELEASE_TABLET | Freq: Once | ORAL | Status: AC
  Administered 2016-08-08: 40 meq via ORAL
  Filled 2016-08-07: qty 2

## 2016-08-07 MED ORDER — DEXTROSE 5 % IV SOLN
2.0000 g | Freq: Once | INTRAVENOUS | Status: AC
  Administered 2016-08-08: 2 g via INTRAVENOUS
  Filled 2016-08-07: qty 2

## 2016-08-07 MED ORDER — LORAZEPAM 2 MG/ML IJ SOLN
1.0000 mg | Freq: Once | INTRAMUSCULAR | Status: AC
  Administered 2016-08-07: 1 mg via INTRAVENOUS
  Filled 2016-08-07: qty 1

## 2016-08-07 MED ORDER — ONDANSETRON HCL 4 MG/2ML IJ SOLN
4.0000 mg | Freq: Once | INTRAMUSCULAR | Status: AC
  Administered 2016-08-07: 4 mg via INTRAVENOUS
  Filled 2016-08-07: qty 2

## 2016-08-07 MED ORDER — SODIUM CHLORIDE 0.9 % IV BOLUS (SEPSIS)
1000.0000 mL | Freq: Once | INTRAVENOUS | Status: AC
Start: 2016-08-07 — End: 2016-08-08
  Administered 2016-08-07: 1000 mL via INTRAVENOUS

## 2016-08-07 MED ORDER — MORPHINE SULFATE (PF) 4 MG/ML IV SOLN
4.0000 mg | Freq: Once | INTRAVENOUS | Status: AC
  Administered 2016-08-07: 4 mg via INTRAVENOUS
  Filled 2016-08-07: qty 1

## 2016-08-07 NOTE — ED Notes (Signed)
Patient transported to X-ray 

## 2016-08-07 NOTE — ED Notes (Signed)
Coude foley catheter placed per EDP request, strait tip foley insertion attempted prior to the coude been placed, 2 RN on room to place coude foley, pt cleaned and linens changed prior to coude inserted, aseptic technic use for placement, pt c/o pain about foley, urine return noticed, EDP notified.

## 2016-08-07 NOTE — ED Triage Notes (Signed)
Urinary retention for 2 weeks he was seen at an office and told to come here and have labs checked

## 2016-08-07 NOTE — ED Provider Notes (Signed)
[facility] DEPT Provider Note   CSN: [id number] Arrival date & time: 08/07/16  1715     History   Chief Complaint Chief Complaint  Patient presents with  . Urinary Retention    HPI Eric Padilla is a 61 y.o. male.  Pt presents to the ED today with urinary retention, weight loss.   The pt has lost about 40 pounds in the last month without trying.  The pt has been to see his doctor ([facility]) who has been following patient.  They did refer him to nephrology and was supposed to see them today.  He was told to come to the ED today after that visit.  The pt also has elevated alk phos and BPH, so his doctor was concerned about prostate cancer.  PSA ordered and was negative.    HPI  Past Medical History:  Diagnosis Date  . Carotid artery occlusion   . Foot pain 11/22/2010   while lying flat  . Hyperlipidemia   . Hypertension   . Leg pain    with walking  . Peripheral arterial disease [facility])     Patient Active Problem List   Diagnosis Date Noted  . Adenomatous polyp of colon 03/27/2015  . COPD, moderate ([place]) 10/11/2013  . Essential hypertension 09/27/2013  . Tobacco use disorder 09/27/2013  . PAD (peripheral artery disease) ([place]) 04/18/2011    Past Surgical History:  Procedure Laterality Date  . COLONOSCOPY  May 2015  . PR VEIN BYPASS GRAFT,AORTO-FEM-POP  01/07/11   (L)-(R) fem-fem; (R) fem-AK pop BP       Home Medications    Prior to Admission medications   Medication Sig Start Date End Date Taking? Authorizing Provider  aspirin EC 81 MG tablet Take 1 tablet (81 mg total) by mouth daily. 12/22/14  Yes Patrecia Pour, MD  atorvastatin (LIPITOR) 40 MG tablet Take 1 tablet (40 mg total) by mouth daily. 04/15/16  Yes [place] N Rumley, DO  hydrochlorothiazide (HYDRODIURIL) 50 MG tablet Take 1 tablet (50 mg total) by mouth daily. 06/10/16  Yes  N Rumley, DO  losartan (COZAAR) 50 MG tablet TAKE 1 TABLET BY MOUTH EVERY DAY 11/07/15  Yes Patrecia Pour, MD    metoprolol succinate (TOPROL-XL) 100 MG 24 hr tablet Take 1 tablet (100 mg total) by mouth daily. Take with or immediately following a meal. 06/18/16  Yes Lorna Few, DO    Family History Family History  Problem Relation Age of Onset  . Stroke Mother   . Heart disease Father   . Breast cancer Sister     Social History Social History  Substance Use Topics  . Smoking status: Former Smoker    Packs/day: 0.15    Years: 20.00    Types: Cigarettes    Start date: 03/30/2014    Quit date: 01/27/2015  . Smokeless tobacco: Never Used  . Alcohol use 7.2 - 8.4 oz/week    12 - 14 Standard drinks or equivalent per week     Allergies   Patient has no known allergies.   Review of Systems Review of Systems  Constitutional: Positive for fatigue and unexpected weight change.  Genitourinary: Positive for difficulty urinating.  All other systems reviewed and are negative.    Physical Exam Updated Vital Signs BP 132/64   Pulse 109   Temp 99.1 F (37.3 C)   Resp 23   Ht _0  (1.803 m)   Wt 130 lb (59 kg)   SpO2 95%  BMI 18.13 kg/m   Physical Exam  Constitutional: He is oriented to person, place, and time. He appears well-developed and well-nourished.  HENT:  Head: Normocephalic and atraumatic.  Right Ear: External ear normal.  Left Ear: External ear normal.  Nose: Nose normal.  Mouth/Throat: Oropharynx is clear and moist.  Eyes: Conjunctivae and EOM are normal. Pupils are equal, round, and reactive to light.  Neck: Normal range of motion. Neck supple.  Cardiovascular: Regular rhythm, normal heart sounds and intact distal pulses.  Tachycardia present.   Pulmonary/Chest: Effort normal and breath sounds normal.  Abdominal: Soft. Bowel sounds are normal.  Musculoskeletal: Normal range of motion.  Neurological: He is alert and oriented to person, place, and time.  Skin: Skin is warm.  Psychiatric: He has a normal mood and affect. His behavior is normal. Judgment and  thought content normal.  Nursing note and vitals reviewed.    ED Treatments / Results  Labs (all labs ordered are listed, but only abnormal results are displayed) Labs Reviewed  COMPREHENSIVE METABOLIC PANEL - Abnormal; Notable for the following:       Result Value   Sodium 133 (*)    Potassium 2.9 (*)    Chloride 94 (*)    Glucose, Bld 127 (*)    BUN 33 (*)    Creatinine, Ser 2.38 (*)    Albumin 3.2 (*)    ALT 11 (*)    Alkaline Phosphatase 143 (*)    GFR calc non Af Amer 28 (*)    GFR calc Af Amer 32 (*)    All other components within normal limits  URINALYSIS, ROUTINE W REFLEX MICROSCOPIC - Abnormal; Notable for the following:    APPearance HAZY (*)    Hgb urine dipstick LARGE (*)    Ketones, ur 5 (*)    Protein, ur 100 (*)    Leukocytes, UA LARGE (*)    Bacteria, UA RARE (*)    All other components within normal limits  CBC WITH DIFFERENTIAL/PLATELET - Abnormal; Notable for the following:    WBC 17.0 (*)    RBC 4.14 (*)    HCT 37.6 (*)    Platelets 448 (*)    Neutro Abs 14.5 (*)    Monocytes Absolute 1.2 (*)    All other components within normal limits  I-STAT CG4 LACTIC ACID, ED - Abnormal; Notable for the following:    Lactic Acid, Venous 2.97 (*)    All other components within normal limits  CULTURE, BLOOD (ROUTINE X 2)  CULTURE, BLOOD (ROUTINE X 2)  URINE CULTURE  LIPASE, BLOOD  I-STAT CG4 LACTIC ACID, ED    EKG  HR 99.  No st elevation.  No t wave changes.  Radiology Ct Abdomen Pelvis Wo Contrast  Result Date: 08/07/2016 CLINICAL DATA:  Urinary retention for 2 weeks EXAM: CT ABDOMEN AND PELVIS WITHOUT CONTRAST TECHNIQUE: Multidetector CT imaging of the abdomen and pelvis was performed following the standard protocol without IV contrast. COMPARISON:  None. FINDINGS: Lower chest: No acute abnormality. Hepatobiliary: No focal liver abnormality is seen. No gallstones, gallbladder wall thickening, or biliary dilatation. Pancreas: Unremarkable. No pancreatic  ductal dilatation or surrounding inflammatory changes. Spleen: Normal in size without focal abnormality. Adrenals/Urinary Tract: Both adrenals are normal. No significant renal parenchymal lesions. There is marked hydronephrosis and ureteral dilatation bilaterally. The urinary bladder is markedly irregular and contains a mass, blood clot or both. The mass/clot may be as large as 7 cm. Stomach/Bowel: Small hiatal hernia. Stomach and  small bowel are otherwise unremarkable. Appendix is normal. Colon is remarkable only for mild uncomplicated diverticulosis. Vascular/Lymphatic: The abdominal aorta is normal in caliber with extensive atherosclerotic calcification. The major branches of the aorta are also heavily calcified. There is a femoral-femoral graft, the patency of which cannot be determined without intravenous contrast. There is fluid around the graft and there also is a fluid collection in the left inguinal region. No pathologic adenopathy is evident in the abdomen or pelvis. Reproductive: New unremarkable Other: No ascites. Musculoskeletal: No significant skeletal lesions. IMPRESSION: 1. Abnormal soft tissue within the urinary bladder lumen consistent with mass or clot or both. 2. Marked hydronephrosis and hydroureter bilaterally. 3. Extensive atherosclerotic calcification throughout the normal caliber aorta and its major branches. 4. Fluid surrounding the femoral-femoral graft. Fluid collections in the inguinal regions near the graft anastomoses, left larger than right. 5. Hiatal hernia 6. Diverticulosis Electronically Signed   By: Andreas Newport M.D.   On: 08/07/2016 21:21   Dg Chest 2 View  Result Date: 08/07/2016 CLINICAL DATA:  61 y/o  M; shortness of breath.  COPD. EXAM: CHEST  2 VIEW COMPARISON:  01/01/2011 chest radiograph. FINDINGS: Stable normal cardiac silhouette. Emphysema and lung hyperinflation compatible with COPD is stable. No consolidation, effusion, or pneumothorax. Mild multilevel  degenerative changes of the thoracic spine and lower thoracic dextrocurvature. IMPRESSION: Findings of COPD and emphysema.  No acute cardiopulmonary process. Electronically Signed   By: Kristine Garbe M.D.   On: 08/07/2016 20:53    Procedures Procedures (including critical care time)  Medications Ordered in ED Medications  potassium chloride SA (K-DUR,KLOR-CON) CR tablet 40 mEq (not administered)  cefTRIAXone (ROCEPHIN) 2 g in dextrose 5 % 50 mL IVPB (not administered)  sodium chloride 0.9 % bolus 1,000 mL (0 mLs Intravenous Stopped 08/07/16 2241)  sodium chloride 0.9 % bolus 1,000 mL (0 mLs Intravenous Stopped 08/07/16 2242)  sodium chloride 0.9 % bolus 1,000 mL (1,000 mLs Intravenous New Bag/Given 08/07/16 2217)  LORazepam (ATIVAN) injection 1 mg (1 mg Intravenous Given 08/07/16 2218)  morphine 4 MG/ML injection 4 mg (4 mg Intravenous Given 08/07/16 2218)  ondansetron (ZOFRAN) injection 4 mg (4 mg Intravenous Given 08/07/16 2218)     Initial Impression / Assessment and Plan / ED Course  I have reviewed the triage vital signs and the nursing notes.  Pertinent labs & imaging results that were available during my care of the patient were reviewed by me and considered in my medical decision making (see chart for details).    Foley catheter placed with difficulty using a coude catheter.   Pt given IVFs and IV rocephin.   The pt d/w FP resident for admission.  Final Clinical Impressions(s) / ED Diagnoses   Final diagnoses:  Acute renal failure superimposed on stage 4 chronic kidney disease, unspecified acute renal failure type (HCC)  Hypokalemia  Benign prostatic hyperplasia with urinary frequency  Mass of urinary bladder  Other hydronephrosis  Acute urinary retention  Acute cystitis with hematuria    New Prescriptions New Prescriptions   No medications on file     Isla Pence, MD 08/07/16 2337

## 2016-08-07 NOTE — ED Notes (Signed)
Bladder scanned 360ML

## 2016-08-07 NOTE — H&P (Signed)
[facility] Admission History and Physical Service Pager: [phone]  Patient name: Eric Padilla Medical record number: [id number] Date of birth: [birth year] Age: 61 y.o. Gender: male  Primary Care Provider: Junie Panning, DO Consultants: None  Code Status: FULL (confirmed upon admission)   Chief Complaint: Urinary Retention   Assessment and Plan: MAYJOR AGER is a 61 y.o. male presenting with urinary retention. PMH is significant for HTN, HLD, and PAD.   Urinary Retention: Marked hydronephrosis and hydroureter bilaterally seen on CT abdomen. Patient with enlarged prostate noted by PCP and on repeat exam at admission. Recent PSA normal. No offending home medications. Coude catheter placed in ED due to inability to advance foley catheter beyond prostate.  -admit to MedSurg, vital signs per unit  -catheter in place  -monitor UOP  -flomax started   UTI: Patient endorsing symptoms consistent with UTI. UA with large HgB, large leukocytes, negative nitrites, TNTC RBCs, and TNTC WBCs. Patient afebrile and without CVA tenderness. Patient with 2/4 SIRs criteria for leukocytosis to 17 and tachycardia. Qsofa score of 0. Lactic acidosis to 2.97, resolved s/p 3L NS.  -continue CTX  -low threshold to broaden antibiotics if worsens  -monitor WBC  -NS at 125 cc/hr x12h  -urine and blood cultures pending   AKI: Cr 2.38 at admission. Cr has been up-trending over the past few lab checks. Worsening of Cr likely due to post-renal obstructive process.  -catheter to decompress bladder  -IVFs as above  -hold home Losartan  Weight Loss: Greater than 20 lb unintentional weight loss over the past few months. Given significant history of tobacco use and hematuria, concerned for malignancy. RBCs/HgB on admission UA may be related to UTI vs. Traumatic catheterization. However, RBCs present on UA from 2/15 when patient without infectious process. Abnormal soft tissue in bladder lumen  visualized on CT abdomen concerning for mass vs. Clot. Recent PSA within normal limits. Alk Phos elevated to 143. LFTs WNL and GGT at outpatient visit 55.  -urology consult in AM  -patient will need outpatient colonoscopy given history of colonoscopy in 2015 with 2 tubular adenomas and 1 tubulovillous adenoma with recommended f/u in 1 year that patient was unable to complete  -HIV ordered   Hypokalemia: K 2.9 at admission.  -hold home HCTZ  -Kdur 40 mEq x2  -KCl 20 mEq in IVF  -repeat BMET in AM  -check Mag   HTN: BPs stable at admission.  -continue Metoprolol  -holding home Valsartan and HCTZ   PAD/HLD: Right to left femoral-femoral bypass graft, right lower extremity femoral-popliteal artery bypass graft with vein and right popliteal endarterectomy performed 01/07/2011. -continue ASA and statin   Alcohol Abuse: Patient endorses drinking 1-2 drinks per night. From PCP note, patient has reported drinking 3-4 drinks nightly recently. Patient denies h/o EtoH withdrawal.  -CIWA  -thiamine, folate, MVI     FEN/GI: NS at 125 cc/hr x12h, Heart Healthy Diet  Prophylaxis: Lovenox   Disposition: Admit to FPTS   History of Present Illness:  Eric Padilla is a 61 y.o. male presenting with urinary retention. Has been seen by his PCP several times over the past month. Noted to have an elevated Cr and Alk Phos in Jan 2018. Cr was elevated on repeat lab and patient was referred to nephrology. Patient went to nephrology appointment and was instructed to go to the ED after his appointment.   PCP has been following patient for rapid, unintentional weight loss this year. Patient has lost >  20lbs over the past few months. He endorses dysuria, dribbling of urine, less frequent urination, and problems initiating urine stream. These have been occurring for >1 month but symptoms have worsened over the past few days. He denies suprapubic pain and sensation of incomplete emptying of his bladder. He denies fevers  and chills at home. Denies rectal pain. He admits to hematuria on and off for several months.   Patient denies personal and family history of cancer. Per chart review, he had abnormal colonoscopy in 2015 that warranted follow up in 1 year which he was unable to complete due to transportation issues.   Review Of Systems: Per HPI with the following additions:  Review of Systems  Constitutional: Positive for weight loss. Negative for chills and fever.  Respiratory: Negative for cough and shortness of breath.   Cardiovascular: Negative for leg swelling.  Gastrointestinal: Negative for nausea and vomiting.  Genitourinary: Positive for dysuria and hematuria. Negative for flank pain.    Patient Active Problem List   Diagnosis Date Noted  . Adenomatous polyp of colon 03/27/2015  . COPD, moderate ([place]) 10/11/2013  . Essential hypertension 09/27/2013  . Tobacco use disorder 09/27/2013  . PAD (peripheral artery disease) ([place]) 04/18/2011    Past Medical History: Past Medical History:  Diagnosis Date  . Carotid artery occlusion   . Foot pain 11/22/2010   while lying flat  . Hyperlipidemia   . Hypertension   . Leg pain    with walking  . Peripheral arterial disease ([place])     Past Surgical History: Past Surgical History:  Procedure Laterality Date  . COLONOSCOPY  May 2015  . PR VEIN BYPASS GRAFT,AORTO-FEM-POP  01/07/11   (L)-(R) fem-fem; (R) fem-AK pop BP    Social History: Social History  Substance Use Topics  . Smoking status: Former Smoker    Packs/day: 0.15    Years: 20.00    Types: Cigarettes    Start date: 03/30/2014    Quit date: 01/27/2015  . Smokeless tobacco: Never Used  . Alcohol use 7.2 - 8.4 oz/week    12 - 14 Standard drinks or equivalent per week   Additional social history: Drinks 1-2 drinks per night. Quit smoking 6 months ago but tobacco use since 61 years old.   Please also refer to relevant sections of EMR.  Family History: Family History  Problem  Relation Age of Onset  . Stroke Mother   . Heart disease Father   . Breast cancer Sister    Allergies and Medications: No Known Allergies No current facility-administered medications on file prior to encounter.    Current Outpatient Prescriptions on File Prior to Encounter  Medication Sig Dispense Refill  . aspirin EC 81 MG tablet Take 1 tablet (81 mg total) by mouth daily. 90 tablet 3  . atorvastatin (LIPITOR) 40 MG tablet Take 1 tablet (40 mg total) by mouth daily. 90 tablet 3  . hydrochlorothiazide (HYDRODIURIL) 50 MG tablet Take 1 tablet (50 mg total) by mouth daily. 90 tablet 0  . losartan (COZAAR) 50 MG tablet TAKE 1 TABLET BY MOUTH EVERY DAY 90 tablet 0  . metoprolol succinate (TOPROL-XL) 100 MG 24 hr tablet Take 1 tablet (100 mg total) by mouth daily. Take with or immediately following a meal. 90 tablet 3    Objective: BP 132/64   Pulse 109   Temp 99.1 F (37.3 C)   Resp 23   Ht 5' 11"  (1.803 m)   Wt 130 lb (59 kg)  SpO2 95%   BMI 18.13 kg/m  Exam: General: thin, cachetic male lying in bed in NAD  Eyes: EOMI, PERRL  ENTM: Oropharynx clear. Mucus membranes mildly dry.  Neck: Supple. Full ROM.  Cardiovascular: Tachycardia. Regular rhythm. No murmur appreciated. No LE edema.  Respiratory: CTAB. Normal WOB.  Gastrointestinal: +BS, soft, NTND  GU: Prostate diffusely enlarged and firm. Non-tender. Foley catheter in place.  MSK: No CVA tenderness. Moves all extremities spontaneously.  Derm: Warm and dry.  Neuro: No focal deficits.  Psych: Appropriate mood and affect.    Labs and Imaging: CBC BMET   Recent Labs Lab 08/07/16 1810  WBC 17.0*  HGB 13.4  HCT 37.6*  PLT 448*    Recent Labs Lab 08/07/16 1809  NA 133*  K 2.9*  CL 94*  CO2 26  BUN 33*  CREATININE 2.38*  GLUCOSE 127*  CALCIUM 9.8     Ct Abdomen Pelvis Wo Contrast  Result Date: 08/07/2016 CLINICAL DATA:  Urinary retention for 2 weeks EXAM: CT ABDOMEN AND PELVIS WITHOUT CONTRAST TECHNIQUE:  Multidetector CT imaging of the abdomen and pelvis was performed following the standard protocol without IV contrast. COMPARISON:  None. FINDINGS: Lower chest: No acute abnormality. Hepatobiliary: No focal liver abnormality is seen. No gallstones, gallbladder wall thickening, or biliary dilatation. Pancreas: Unremarkable. No pancreatic ductal dilatation or surrounding inflammatory changes. Spleen: Normal in size without focal abnormality. Adrenals/Urinary Tract: Both adrenals are normal. No significant renal parenchymal lesions. There is marked hydronephrosis and ureteral dilatation bilaterally. The urinary bladder is markedly irregular and contains a mass, blood clot or both. The mass/clot may be as large as 7 cm. Stomach/Bowel: Small hiatal hernia. Stomach and small bowel are otherwise unremarkable. Appendix is normal. Colon is remarkable only for mild uncomplicated diverticulosis. Vascular/Lymphatic: The abdominal aorta is normal in caliber with extensive atherosclerotic calcification. The major branches of the aorta are also heavily calcified. There is a femoral-femoral graft, the patency of which cannot be determined without intravenous contrast. There is fluid around the graft and there also is a fluid collection in the left inguinal region. No pathologic adenopathy is evident in the abdomen or pelvis. Reproductive: New unremarkable Other: No ascites. Musculoskeletal: No significant skeletal lesions. IMPRESSION: 1. Abnormal soft tissue within the urinary bladder lumen consistent with mass or clot or both. 2. Marked hydronephrosis and hydroureter bilaterally. 3. Extensive atherosclerotic calcification throughout the normal caliber aorta and its major branches. 4. Fluid surrounding the femoral-femoral graft. Fluid collections in the inguinal regions near the graft anastomoses, left larger than right. 5. Hiatal hernia 6. Diverticulosis Electronically Signed   By: Andreas Newport M.D.   On: 08/07/2016 21:21    Dg Chest 2 View  Result Date: 08/07/2016 CLINICAL DATA:  61 y/o  M; shortness of breath.  COPD. EXAM: CHEST  2 VIEW COMPARISON:  01/01/2011 chest radiograph. FINDINGS: Stable normal cardiac silhouette. Emphysema and lung hyperinflation compatible with COPD is stable. No consolidation, effusion, or pneumothorax. Mild multilevel degenerative changes of the thoracic spine and lower thoracic dextrocurvature. IMPRESSION: Findings of COPD and emphysema.  No acute cardiopulmonary process. Electronically Signed   By: Kristine Garbe M.D.   On: 08/07/2016 20:53    Nicolette Bang, DO 08/07/2016, 11:35 PM PGY-2, [facility] pager: [phone], text pages welcome

## 2016-08-07 NOTE — ED Notes (Signed)
RN and EMT attempted to place foley but would not advance past prostate; pt states he had recent check up with stated "swollen prostate" Md notified of attempt fail.

## 2016-08-07 NOTE — ED Triage Notes (Signed)
Acuity and assessment by chris c not kaitlyn nt

## 2016-08-08 ENCOUNTER — Other Ambulatory Visit: Payer: Self-pay | Admitting: Urology

## 2016-08-08 DIAGNOSIS — N131 Hydronephrosis with ureteral stricture, not elsewhere classified: Secondary | ICD-10-CM | POA: Diagnosis present

## 2016-08-08 DIAGNOSIS — R338 Other retention of urine: Secondary | ICD-10-CM | POA: Diagnosis present

## 2016-08-08 DIAGNOSIS — Z7982 Long term (current) use of aspirin: Secondary | ICD-10-CM | POA: Diagnosis not present

## 2016-08-08 DIAGNOSIS — R64 Cachexia: Secondary | ICD-10-CM | POA: Diagnosis present

## 2016-08-08 DIAGNOSIS — R350 Frequency of micturition: Secondary | ICD-10-CM | POA: Diagnosis not present

## 2016-08-08 DIAGNOSIS — E876 Hypokalemia: Secondary | ICD-10-CM | POA: Diagnosis present

## 2016-08-08 DIAGNOSIS — N184 Chronic kidney disease, stage 4 (severe): Secondary | ICD-10-CM | POA: Diagnosis present

## 2016-08-08 DIAGNOSIS — N3289 Other specified disorders of bladder: Secondary | ICD-10-CM | POA: Diagnosis present

## 2016-08-08 DIAGNOSIS — E43 Unspecified severe protein-calorie malnutrition: Secondary | ICD-10-CM | POA: Insufficient documentation

## 2016-08-08 DIAGNOSIS — J439 Emphysema, unspecified: Secondary | ICD-10-CM | POA: Diagnosis present

## 2016-08-08 DIAGNOSIS — E872 Acidosis: Secondary | ICD-10-CM | POA: Diagnosis present

## 2016-08-08 DIAGNOSIS — N179 Acute kidney failure, unspecified: Secondary | ICD-10-CM | POA: Diagnosis present

## 2016-08-08 DIAGNOSIS — N3001 Acute cystitis with hematuria: Secondary | ICD-10-CM | POA: Diagnosis not present

## 2016-08-08 DIAGNOSIS — N1330 Unspecified hydronephrosis: Secondary | ICD-10-CM | POA: Diagnosis not present

## 2016-08-08 DIAGNOSIS — Z79899 Other long term (current) drug therapy: Secondary | ICD-10-CM | POA: Diagnosis not present

## 2016-08-08 DIAGNOSIS — E785 Hyperlipidemia, unspecified: Secondary | ICD-10-CM | POA: Diagnosis present

## 2016-08-08 DIAGNOSIS — Q620 Congenital hydronephrosis: Secondary | ICD-10-CM | POA: Diagnosis not present

## 2016-08-08 DIAGNOSIS — I739 Peripheral vascular disease, unspecified: Secondary | ICD-10-CM | POA: Diagnosis present

## 2016-08-08 DIAGNOSIS — Z87891 Personal history of nicotine dependence: Secondary | ICD-10-CM | POA: Diagnosis not present

## 2016-08-08 DIAGNOSIS — Z681 Body mass index (BMI) 19 or less, adult: Secondary | ICD-10-CM | POA: Diagnosis not present

## 2016-08-08 DIAGNOSIS — N401 Enlarged prostate with lower urinary tract symptoms: Secondary | ICD-10-CM

## 2016-08-08 DIAGNOSIS — F1010 Alcohol abuse, uncomplicated: Secondary | ICD-10-CM | POA: Diagnosis present

## 2016-08-08 DIAGNOSIS — I129 Hypertensive chronic kidney disease with stage 1 through stage 4 chronic kidney disease, or unspecified chronic kidney disease: Secondary | ICD-10-CM | POA: Diagnosis present

## 2016-08-08 LAB — CBC
HCT: 34.9 % — ABNORMAL LOW (ref 39.0–52.0)
Hemoglobin: 11.9 g/dL — ABNORMAL LOW (ref 13.0–17.0)
MCH: 31.5 pg (ref 26.0–34.0)
MCHC: 34.1 g/dL (ref 30.0–36.0)
MCV: 92.3 fL (ref 78.0–100.0)
PLATELETS: 396 10*3/uL (ref 150–400)
RBC: 3.78 MIL/uL — AB (ref 4.22–5.81)
RDW: 13.7 % (ref 11.5–15.5)
WBC: 17 10*3/uL — AB (ref 4.0–10.5)

## 2016-08-08 LAB — BASIC METABOLIC PANEL
ANION GAP: 9 (ref 5–15)
BUN: 28 mg/dL — ABNORMAL HIGH (ref 6–20)
CALCIUM: 8.6 mg/dL — AB (ref 8.9–10.3)
CO2: 26 mmol/L (ref 22–32)
CREATININE: 1.97 mg/dL — AB (ref 0.61–1.24)
Chloride: 101 mmol/L (ref 101–111)
GFR, EST AFRICAN AMERICAN: 40 mL/min — AB (ref >60)
GFR, EST NON AFRICAN AMERICAN: 35 mL/min — AB (ref >60)
Glucose, Bld: 121 mg/dL — ABNORMAL HIGH (ref 65–99)
Potassium: 3 mmol/L — ABNORMAL LOW (ref 3.5–5.1)
SODIUM: 136 mmol/L (ref 135–145)

## 2016-08-08 LAB — CREATININE, URINE, RANDOM: Creatinine, Urine: 140.17 mg/dL

## 2016-08-08 LAB — SODIUM, URINE, RANDOM: Sodium, Ur: 62 mmol/L

## 2016-08-08 LAB — MAGNESIUM: MAGNESIUM: 1.5 mg/dL — AB (ref 1.7–2.4)

## 2016-08-08 LAB — HIV ANTIBODY (ROUTINE TESTING W REFLEX): HIV Screen 4th Generation wRfx: NONREACTIVE

## 2016-08-08 MED ORDER — SODIUM CHLORIDE 0.9 % IV BOLUS (SEPSIS)
1000.0000 mL | Freq: Once | INTRAVENOUS | Status: AC
  Administered 2016-08-08: 1000 mL via INTRAVENOUS

## 2016-08-08 MED ORDER — TAMSULOSIN HCL 0.4 MG PO CAPS
0.4000 mg | ORAL_CAPSULE | Freq: Every day | ORAL | Status: DC
  Administered 2016-08-08 – 2016-08-11 (×4): 0.4 mg via ORAL
  Filled 2016-08-08 (×5): qty 1

## 2016-08-08 MED ORDER — ADULT MULTIVITAMIN W/MINERALS CH
1.0000 | ORAL_TABLET | Freq: Every day | ORAL | Status: DC
  Administered 2016-08-08 – 2016-08-11 (×4): 1 via ORAL
  Filled 2016-08-08 (×4): qty 1

## 2016-08-08 MED ORDER — METOPROLOL SUCCINATE ER 100 MG PO TB24
100.0000 mg | ORAL_TABLET | Freq: Every day | ORAL | Status: DC
  Administered 2016-08-09 – 2016-08-11 (×3): 100 mg via ORAL
  Filled 2016-08-08 (×3): qty 1

## 2016-08-08 MED ORDER — ENOXAPARIN SODIUM 30 MG/0.3ML ~~LOC~~ SOLN: 30.0000 mg | SUBCUTANEOUS | Status: DC

## 2016-08-08 MED ORDER — ENOXAPARIN SODIUM 40 MG/0.4ML ~~LOC~~ SOLN
40.0000 mg | SUBCUTANEOUS | Status: DC
  Administered 2016-08-08: 40 mg via SUBCUTANEOUS
  Filled 2016-08-08: qty 0.4

## 2016-08-08 MED ORDER — POTASSIUM CHLORIDE IN NACL 20-0.9 MEQ/L-% IV SOLN
INTRAVENOUS | Status: AC
  Administered 2016-08-08 (×2): via INTRAVENOUS
  Filled 2016-08-08 (×2): qty 1000

## 2016-08-08 MED ORDER — DEXTROSE 5 % IV SOLN
1.0000 g | INTRAVENOUS | Status: DC
  Administered 2016-08-08: 1 g via INTRAVENOUS
  Filled 2016-08-08: qty 10

## 2016-08-08 MED ORDER — MAGNESIUM SULFATE 2 GM/50ML IV SOLN
2.0000 g | Freq: Once | INTRAVENOUS | Status: AC
  Administered 2016-08-08: 2 g via INTRAVENOUS
  Filled 2016-08-08: qty 50

## 2016-08-08 MED ORDER — VITAMIN B-1 100 MG PO TABS
100.0000 mg | ORAL_TABLET | Freq: Every day | ORAL | Status: DC
  Administered 2016-08-08 – 2016-08-11 (×3): 100 mg via ORAL
  Filled 2016-08-08 (×8): qty 1

## 2016-08-08 MED ORDER — ENSURE ENLIVE PO LIQD
237.0000 mL | Freq: Three times a day (TID) | ORAL | Status: DC
  Administered 2016-08-08 – 2016-08-11 (×6): 237 mL via ORAL

## 2016-08-08 MED ORDER — ASPIRIN EC 81 MG PO TBEC
81.0000 mg | DELAYED_RELEASE_TABLET | Freq: Every day | ORAL | Status: DC
  Administered 2016-08-08 – 2016-08-11 (×4): 81 mg via ORAL
  Filled 2016-08-08 (×4): qty 1

## 2016-08-08 MED ORDER — THIAMINE HCL 100 MG/ML IJ SOLN: 100.0000 mg | Freq: Every day | INTRAMUSCULAR | Status: DC

## 2016-08-08 MED ORDER — FOLIC ACID 1 MG PO TABS
1.0000 mg | ORAL_TABLET | Freq: Every day | ORAL | Status: DC
  Administered 2016-08-08 – 2016-08-11 (×4): 1 mg via ORAL
  Filled 2016-08-08 (×4): qty 1

## 2016-08-08 MED ORDER — ATORVASTATIN CALCIUM 40 MG PO TABS
40.0000 mg | ORAL_TABLET | Freq: Every day | ORAL | Status: DC
  Administered 2016-08-08 – 2016-08-11 (×4): 40 mg via ORAL
  Filled 2016-08-08 (×4): qty 1

## 2016-08-08 MED ORDER — POTASSIUM CHLORIDE CRYS ER 20 MEQ PO TBCR
40.0000 meq | EXTENDED_RELEASE_TABLET | Freq: Two times a day (BID) | ORAL | Status: AC
  Administered 2016-08-08 (×2): 40 meq via ORAL
  Filled 2016-08-08 (×2): qty 2

## 2016-08-08 NOTE — Progress Notes (Signed)
Family Medicine Teaching Service Daily Progress Note Intern Pager: [phone]  Patient name: Eric Padilla Medical record number: [id number] Date of birth: 1956/05/31 Age: 61 y.o. Gender: male  Primary Care Provider: Junie Panning, DO Consultants: Urology Code Status: Full code  Pt Overview and Major Events to Date:  1. Admit to FMTS  Assessment and Plan: Eric Padilla is a 61 y.o. male presenting with urinary retention. PMH is significant for HTN, HLD, and PAD.   Urinary Retention: Hydronephrosis and hydroureter bilaterally seen on CT abdomen. Enlarged prostate and recent normal PSA. Coude catheter placed in ED due to inability to advance foley catheter beyond prostate. Received 4 boluses in ED and UOP only 200cc. Denies abd pain.  -catheter in place  -monitor UOP  -flomax started  - Urology consulted  UTI:  Endorsing dysuria and increased frequency, along with UA consistent with UTI and TNTC RBCs. Afebrile and no CVA tenderness. Tachycardic to 122, likely 2/2 volume status.  Lactic acidosis to 2.97 trended down to 1.55.   -continue CTX  -low threshold to broaden antibiotics if worsens  -monitor WBC  -NS at 125 cc/hr x12h and s/p 4L boluses -urine and blood cultures pending   AKI: Cr 2.38 at admission. Cr has been up-trending over the past few lab checks. Worsening of Cr likely due to post-renal obstructive process. Cr down to 1.97 this AM -catheter to decompress bladder  - f/u UOP -hold home Losartan  Weight Loss:  >20 pound unintentional weight loss over past few months along with painless hematuria and significant long term smoking history. Abnormal soft tissue in bladder lumen visualized on CT abdomen concerning for mass vs. Clot. Recent PSA within normal limits. Alk Phos elevated to 143. LFTs WNL and GGT at outpatient visit 52.  -urology consulted; awaiting recs -patient will need outpatient colonoscopy given history of colonoscopy in 2015 with 2 tubular adenomas and 1  tubulovillous adenoma with recommended f/u in 1 year that patient was unable to complete  -HIV ordered   Hypokalemia: K 2.9 at admission. 3.0 this morning. Mag to 1.5 as well.  - mag sulfate 2g and kdur 101mq x2 -hold home HCTZ  -s/p KCl 20 mEq in IVF  -AM BMP  HTN: BPs stable at admission.  -continue Metoprolol  -holding home Valsartan and HCTZ   PAD/HLD: Right to left femoral-femoral bypass graft, right lower extremity femoral-popliteal artery bypass graft with vein and right popliteal endarterectomy performed 01/07/2011. -continue ASA and statin   Alcohol Abuse: Patient endorses drinking 1-2 drinks per night. From PCP note, patient has reported drinking 3-4 drinks nightly recently. Patient denies h/o EtoH withdrawal.  -CIWA  -thiamine, folate, MVI     FEN/GI: Heart Healthy Diet  Prophylaxis: Lovenox   Disposition: Admit to FPTS   Subjective:  Feels well this morning. Only complaint is that coude cath is uncomfortable. Denies CP, SOB, NV or diarrhea.   Objective: Temp:  [98.2 F (36.8 C)-99.1 F (37.3 C)] 98.2 F (36.8 C) (03/08 0538) Pulse Rate:  [25-137] 122 (03/08 0538) Resp:  [14-31] 18 (03/08 0538) BP: (98-141)/(37-83) 141/71 (03/08 0538) SpO2:  [95 %-99 %] 96 % (03/08 0538) Weight:  [130 lb (59 kg)-137 lb 6.4 oz (62.3 kg)] 137 lb 6.4 oz (62.3 kg) (03/08 0150) Physical Exam: General: 89yoM sitting up in bed appearing comfortable Cardiovascular: RRR, no murmurs Respiratory: no increased WOB, diminished breath sounds, but CTABL Abdomen: soft, NTND, no palpable masses Extremities: warm and well perfused  Laboratory:  Recent Labs  Lab 08/07/16 1810 08/08/16 0338  WBC 17.0* 17.0*  HGB 13.4 11.9*  HCT 37.6* 34.9*  PLT 448* 396    Recent Labs Lab 08/07/16 1809 08/08/16 0338  NA 133* 136  K 2.9* 3.0*  CL 94* 101  CO2 26 26  BUN 33* 28*  CREATININE 2.38* 1.97*  CALCIUM 9.8 8.6*  PROT 6.9  --   BILITOT 0.8  --   ALKPHOS 143*  --   ALT 11*  --    AST 18  --   GLUCOSE 127* 121*      Imaging/Diagnostic Tests: Ct Abdomen Pelvis Wo Contrast  Result Date: 08/07/2016 CLINICAL DATA:  Urinary retention for 2 weeks EXAM: CT ABDOMEN AND PELVIS WITHOUT CONTRAST TECHNIQUE: Multidetector CT imaging of the abdomen and pelvis was performed following the standard protocol without IV contrast. COMPARISON:  None. FINDINGS: Lower chest: No acute abnormality. Hepatobiliary: No focal liver abnormality is seen. No gallstones, gallbladder wall thickening, or biliary dilatation. Pancreas: Unremarkable. No pancreatic ductal dilatation or surrounding inflammatory changes. Spleen: Normal in size without focal abnormality. Adrenals/Urinary Tract: Both adrenals are normal. No significant renal parenchymal lesions. There is marked hydronephrosis and ureteral dilatation bilaterally. The urinary bladder is markedly irregular and contains a mass, blood clot or both. The mass/clot may be as large as 7 cm. Stomach/Bowel: Small hiatal hernia. Stomach and small bowel are otherwise unremarkable. Appendix is normal. Colon is remarkable only for mild uncomplicated diverticulosis. Vascular/Lymphatic: The abdominal aorta is normal in caliber with extensive atherosclerotic calcification. The major branches of the aorta are also heavily calcified. There is a femoral-femoral graft, the patency of which cannot be determined without intravenous contrast. There is fluid around the graft and there also is a fluid collection in the left inguinal region. No pathologic adenopathy is evident in the abdomen or pelvis. Reproductive: New unremarkable Other: No ascites. Musculoskeletal: No significant skeletal lesions. IMPRESSION: 1. Abnormal soft tissue within the urinary bladder lumen consistent with mass or clot or both. 2. Marked hydronephrosis and hydroureter bilaterally. 3. Extensive atherosclerotic calcification throughout the normal caliber aorta and its major branches. 4. Fluid surrounding the  femoral-femoral graft. Fluid collections in the inguinal regions near the graft anastomoses, left larger than right. 5. Hiatal hernia 6. Diverticulosis Electronically Signed   By: Andreas Newport M.D.   On: 08/07/2016 21:21   Dg Chest 2 View  Result Date: 08/07/2016 CLINICAL DATA:  61 y/o  M; shortness of breath.  COPD. EXAM: CHEST  2 VIEW COMPARISON:  01/01/2011 chest radiograph. FINDINGS: Stable normal cardiac silhouette. Emphysema and lung hyperinflation compatible with COPD is stable. No consolidation, effusion, or pneumothorax. Mild multilevel degenerative changes of the thoracic spine and lower thoracic dextrocurvature. IMPRESSION: Findings of COPD and emphysema.  No acute cardiopulmonary process. Electronically Signed   By: Kristine Garbe M.D.   On: 08/07/2016 20:53    Eloise Levels, MD 08/08/2016, 9:55 AM PGY-1, [place] Intern pager: [phone], text pages welcome

## 2016-08-08 NOTE — Progress Notes (Signed)
Initial Nutrition Assessment  DOCUMENTATION CODES:   Severe malnutrition in context of chronic illness  INTERVENTION:   -Ensure Enlive po TID, each supplement provides 350 kcal and 20 grams of protein  NUTRITION DIAGNOSIS:   Malnutrition related to chronic illness (possible metastatic process) as evidenced by percent weight loss, energy intake < 75% for > or equal to 1 month, severe depletion of body fat, moderate depletions of muscle mass, severe depletion of muscle mass.  GOAL:   Patient will meet greater than or equal to 90% of their needs  MONITOR:   PO intake, Supplement acceptance, Labs, Weight trends, Skin, I & O's  REASON FOR ASSESSMENT:   Malnutrition Screening Tool    ASSESSMENT:   Eric Padilla is a 61 y.o. male presenting with urinary retention. PMH is significant for HTN, HLD, and PAD.   Pt admitted with urinary retention with abnormal imaging of bladder on CT scan, questionable for neoplastic process.   Pt awaiting urology consult.   Spoke with pt at bedside, who reports an overall decline in health over the past month, endorsing poor appetite, weight loss, and generalized weakness as a result of urinary retention. Pt reports he typically consumes 2 meals per day (Lunch: sandwich, Dinner: meat, starch, and vegetable). Over the past month, pt is still consuming same food items and frequency, but eating about 50% than usual. Observed breakfast tray this AM- pt consumed 100% of fruit juice, and 50% of eggs and breakfast potatoes.   Pt shares UBW of around 160-165#, endorsing a 30# wt loss over the past month. Per documented wt hx, pt has experienced a 23# (14%) wt loss over the past 5 months, which is significant for time frame.  Nutrition-Focused physical exam completed. Findings are severe fat depletion, moderate to severe muscle depletion, and no edema.   Discussed importance of good protein intake to support healing and preservation of weight and lean body mass.  Pt amenable to supplements- will try Ensure. Also encouraged pt to eat food off of his meal trays.   Labs reviewed: K: 3.0.  Diet Order:  Diet Heart Room service appropriate? Yes; Fluid consistency: Thin  Skin:  Reviewed, no issues  Last BM:  08/07/16  Height:   Ht Readings from Last 1 Encounters:  08/08/16 5\' 11"  (1.803 m)    Weight:   Wt Readings from Last 1 Encounters:  08/08/16 137 lb 6.4 oz (62.3 kg)    Ideal Body Weight:  78.2 kg  BMI:  Body mass index is 19.16 kg/m.  Estimated Nutritional Needs:   Kcal:  1850-2050  Protein:  90-105 grams  Fluid:  >1.8 L  EDUCATION NEEDS:   Education needs addressed  Kristan Brummitt A. Jimmye Norman, RD, LDN, CDE Pager: [phone] After hours Pager: [phone]

## 2016-08-08 NOTE — Consult Note (Addendum)
Urology Consult   Physician requesting consult: Dorcas Mcmurray  Reason for consult: AUR and bladder mass  History of Present Illness: Eric Padilla is a 61 y.o. male with PMH significant for HTN, carotid artery disease, hyperlipidemia, and PVD who was admitted last night due to urinary retention and AKI.  In ED Cr was 2.38 (baseline 1.1-1.5).  CT A/P revealed bilateral hydronephrosis and a bladder mass vs clot or both.  H/H 13/37.  UA nitrite negative with TNTC RBCs and WBCs and few bacteria.  WBC 17.  Blood and urine cultures are pending. He was started on Rocephin. A foley was placed with little urine output but this was thought to be due to severe dehydration.  He has been given large amounts of fluid and Cr improved to 1.97.   He states that 2 months ago he began to have gross hematuria, urinary frequency, urgency, mixed incontinence, dysuria, slow stream, feelings of incomplete emptying, and nocturia x 6.  He has had to use approx 4 pads per day due to incontinence. Prior to 2 months ago he states he urinated with no issues.  He has noted a decreased appetite and approx 30 lb weight loss over the past 4 weeks.  He has been told he had an enlarged prostate in the past by his PCP.  PSA 1.6 on 07/18/16. His PCP has been following his weight loss and elevated Cr since Jan 2018.  He also had an abnormal colonoscopy in 2015 for which he was supposed to f/u in a year but did not due to transportation issues.   He quit smoking 6 months ago after smoking 1ppd for approx 40 years.  He drinks 2 ETOH beverages per day.  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   He is currently resting comfortably and his only complaint is of foley discomfort.   He denies F/C, HA, SOB, N/V, and diarrhea/constipation.    Past Medical History:  Diagnosis Date  . Carotid artery occlusion   . Foot pain 11/22/2010   while lying flat  . Hyperlipidemia   . Hypertension   .  Leg pain    with walking  . Peripheral arterial disease [facility])     Past Surgical History:  Procedure Laterality Date  . COLONOSCOPY  May 2015  . PR VEIN BYPASS GRAFT,AORTO-FEM-POP  01/07/11   (L)-(R) fem-fem; (R) fem-AK pop BP    Current Hospital Medications:  Home Meds:  Current Meds  Medication Sig  . aspirin EC 81 MG tablet Take 1 tablet (81 mg total) by mouth daily.  Marland Kitchen atorvastatin (LIPITOR) 40 MG tablet Take 1 tablet (40 mg total) by mouth daily.  . hydrochlorothiazide (HYDRODIURIL) 50 MG tablet Take 1 tablet (50 mg total) by mouth daily.  Marland Kitchen losartan (COZAAR) 50 MG tablet TAKE 1 TABLET BY MOUTH EVERY DAY  . metoprolol succinate (TOPROL-XL) 100 MG 24 hr tablet Take 1 tablet (100 mg total) by mouth daily. Take with or immediately following a meal.    Scheduled Meds: . aspirin EC  81 mg Oral Daily  . atorvastatin  40 mg Oral q1800  . cefTRIAXone (ROCEPHIN)  IV  1 g Intravenous Q24H  . enoxaparin (LOVENOX) injection  40 mg Subcutaneous Q24H  . feeding supplement (ENSURE ENLIVE)  237 mL Oral TID BM  . folic acid  1 mg Oral Daily  . [START ON 08/09/2016] metoprolol succinate  100 mg Oral Daily  .  multivitamin with minerals  1 tablet Oral Daily  . tamsulosin  0.4 mg Oral Daily  . thiamine  100 mg Oral Daily   Or  . thiamine  100 mg Intravenous Daily   Continuous Infusions: PRN Meds:.  Allergies: No Known Allergies  Family History  Problem Relation Age of Onset  . Stroke Mother   . Heart disease Father   . Breast cancer Sister     Social History:  reports that he quit smoking about 18 months ago. His smoking use included Cigarettes. He started smoking about 2 years ago. He has a 3.00 pack-year smoking history. He has never used smokeless tobacco. He reports that he drinks about 7.2 - 8.4 oz of alcohol per week . He reports that he does not use drugs.  ROS: A complete review of systems was performed.  All systems are negative except for pertinent findings as  noted.  Physical Exam:  Vital signs in last 24 hours: Temp:  [97.8 F (36.6 C)-99.1 F (37.3 C)] 97.8 F (36.6 C) (03/08 1554) Pulse Rate:  [25-137] 98 (03/08 1554) Resp:  [14-31] 18 (03/08 1554) BP: (98-141)/(37-83) 112/57 (03/08 1554) SpO2:  [95 %-99 %] 98 % (03/08 1554) Weight:  [59 kg (130 lb)-62.3 kg (137 lb 6.4 oz)] 62.3 kg (137 lb 6.4 oz) (03/08 0150) Constitutional:  Alert and oriented, No acute distress Cardiovascular: Regular rate and rhythm Respiratory: Normal respiratory effort GI: Abd nontender, nondistended; suprapubic area is firm to palp GU: 36f foley in place with clear/yellow urine in bag; circ penis with no discharge or skin breakdown Lymphatic: No lymphadenopathy Neurologic: Grossly intact, no focal deficits Psychiatric: Normal mood and affect  Laboratory Data:   Recent Labs  08/07/16 1810 08/08/16 0338  WBC 17.0* 17.0*  HGB 13.4 11.9*  HCT 37.6* 34.9*  PLT 448* 396     Recent Labs  08/07/16 1809 08/08/16 0338  NA 133* 136  K 2.9* 3.0*  CL 94* 101  GLUCOSE 127* 121*  BUN 33* 28*  CALCIUM 9.8 8.6*  CREATININE 2.38* 1.97*     Results for orders placed or performed during the hospital encounter of 08/07/16 (from the past 24 hour(s))  Lipase, blood     Status: None   Collection Time: 08/07/16  6:09 PM  Result Value Ref Range   Lipase 22 11 - 51 U/L  Comprehensive metabolic panel     Status: Abnormal   Collection Time: 08/07/16  6:09 PM  Result Value Ref Range   Sodium 133 (L) 135 - 145 mmol/L   Potassium 2.9 (L) 3.5 - 5.1 mmol/L   Chloride 94 (L) 101 - 111 mmol/L   CO2 26 22 - 32 mmol/L   Glucose, Bld 127 (H) 65 - 99 mg/dL   BUN 33 (H) 6 - 20 mg/dL   Creatinine, Ser 2.38 (H) 0.61 - 1.24 mg/dL   Calcium 9.8 8.9 - 10.3 mg/dL   Total Protein 6.9 6.5 - 8.1 g/dL   Albumin 3.2 (L) 3.5 - 5.0 g/dL   AST 18 15 - 41 U/L   ALT 11 (L) 17 - 63 U/L   Alkaline Phosphatase 143 (H) 38 - 126 U/L   Total Bilirubin 0.8 0.3 - 1.2 mg/dL   GFR calc non  Af Amer 28 (L) >60 mL/min   GFR calc Af Amer 32 (L) >60 mL/min   Anion gap 13 5 - 15  CBC with Differential     Status: Abnormal   Collection Time: 08/07/16  6:10  PM  Result Value Ref Range   WBC 17.0 (H) 4.0 - 10.5 K/uL   RBC 4.14 (L) 4.22 - 5.81 MIL/uL   Hemoglobin 13.4 13.0 - 17.0 g/dL   HCT 37.6 (L) 39.0 - 52.0 %   MCV 90.8 78.0 - 100.0 fL   MCH 32.4 26.0 - 34.0 pg   MCHC 35.6 30.0 - 36.0 g/dL   RDW 13.4 11.5 - 15.5 %   Platelets 448 (H) 150 - 400 K/uL   Neutrophils Relative % 86 %   Neutro Abs 14.5 (H) 1.7 - 7.7 K/uL   Lymphocytes Relative 7 %   Lymphs Abs 1.2 0.7 - 4.0 K/uL   Monocytes Relative 7 %   Monocytes Absolute 1.2 (H) 0.1 - 1.0 K/uL   Eosinophils Relative 0 %   Eosinophils Absolute 0.1 0.0 - 0.7 K/uL   Basophils Relative 0 %   Basophils Absolute 0.0 0.0 - 0.1 K/uL  Blood Culture (routine x 2)     Status: None (Preliminary result)   Collection Time: 08/07/16  6:10 PM  Result Value Ref Range   Specimen Description BLOOD RIGHT ANTECUBITAL    Special Requests BOTTLES DRAWN AEROBIC ONLY 5CC    Culture NO GROWTH < 24 HOURS    Report Status PENDING   Blood Culture (routine x 2)     Status: None (Preliminary result)   Collection Time: 08/07/16  6:11 PM  Result Value Ref Range   Specimen Description BLOOD LEFT FOREARM    Special Requests IN PEDIATRIC BOTTLE 2CC    Culture NO GROWTH < 24 HOURS    Report Status PENDING   I-Stat CG4 Lactic Acid, ED  (not at  [facility])     Status: Abnormal   Collection Time: 08/07/16  6:53 PM  Result Value Ref Range   Lactic Acid, Venous 2.97 (HH) 0.5 - 1.9 mmol/L   Comment NOTIFIED PHYSICIAN   I-Stat CG4 Lactic Acid, ED  (not at  [facility])     Status: None   Collection Time: 08/07/16  9:16 PM  Result Value Ref Range   Lactic Acid, Venous 1.55 0.5 - 1.9 mmol/L  Urinalysis, Routine w reflex microscopic     Status: Abnormal   Collection Time: 08/07/16 10:40 PM  Result Value Ref Range   Color, Urine YELLOW YELLOW   APPearance HAZY (A) CLEAR    Specific Gravity, Urine 1.013 1.005 - 1.030   pH 6.0 5.0 - 8.0   Glucose, UA NEGATIVE NEGATIVE mg/dL   Hgb urine dipstick LARGE (A) NEGATIVE   Bilirubin Urine NEGATIVE NEGATIVE   Ketones, ur 5 (A) NEGATIVE mg/dL   Protein, ur 100 (A) NEGATIVE mg/dL   Nitrite NEGATIVE NEGATIVE   Leukocytes, UA LARGE (A) NEGATIVE   RBC / HPF TOO NUMEROUS TO COUNT 0 - 5 RBC/hpf   WBC, UA TOO NUMEROUS TO COUNT 0 - 5 WBC/hpf   Bacteria, UA RARE (A) NONE SEEN   Squamous Epithelial / LPF NONE SEEN NONE SEEN   Mucous PRESENT   HIV antibody (Routine Testing)     Status: None   Collection Time: 08/08/16  3:38 AM  Result Value Ref Range   HIV Screen 4th Generation wRfx Non Reactive Non Reactive  Basic metabolic panel     Status: Abnormal   Collection Time: 08/08/16  3:38 AM  Result Value Ref Range   Sodium 136 135 - 145 mmol/L   Potassium 3.0 (L) 3.5 - 5.1 mmol/L   Chloride 101 101 - 111 mmol/L  CO2 26 22 - 32 mmol/L   Glucose, Bld 121 (H) 65 - 99 mg/dL   BUN 28 (H) 6 - 20 mg/dL   Creatinine, Ser 1.97 (H) 0.61 - 1.24 mg/dL   Calcium 8.6 (L) 8.9 - 10.3 mg/dL   GFR calc non Af Amer 35 (L) >60 mL/min   GFR calc Af Amer 40 (L) >60 mL/min   Anion gap 9 5 - 15  CBC     Status: Abnormal   Collection Time: 08/08/16  3:38 AM  Result Value Ref Range   WBC 17.0 (H) 4.0 - 10.5 K/uL   RBC 3.78 (L) 4.22 - 5.81 MIL/uL   Hemoglobin 11.9 (L) 13.0 - 17.0 g/dL   HCT 34.9 (L) 39.0 - 52.0 %   MCV 92.3 78.0 - 100.0 fL   MCH 31.5 26.0 - 34.0 pg   MCHC 34.1 30.0 - 36.0 g/dL   RDW 13.7 11.5 - 15.5 %   Platelets 396 150 - 400 K/uL  Magnesium     Status: Abnormal   Collection Time: 08/08/16  3:38 AM  Result Value Ref Range   Magnesium 1.5 (L) 1.7 - 2.4 mg/dL   Recent Results (from the past 240 hour(s))  Blood Culture (routine x 2)     Status: None (Preliminary result)   Collection Time: 08/07/16  6:10 PM  Result Value Ref Range Status   Specimen Description BLOOD RIGHT ANTECUBITAL  Final   Special Requests  BOTTLES DRAWN AEROBIC ONLY 5CC  Final   Culture NO GROWTH < 24 HOURS  Final   Report Status PENDING  Incomplete  Blood Culture (routine x 2)     Status: None (Preliminary result)   Collection Time: 08/07/16  6:11 PM  Result Value Ref Range Status   Specimen Description BLOOD LEFT FOREARM  Final   Special Requests IN PEDIATRIC BOTTLE 2CC  Final   Culture NO GROWTH < 24 HOURS  Final   Report Status PENDING  Incomplete    Renal Function:  Recent Labs  08/07/16 1809 08/08/16 0338  CREATININE 2.38* 1.97*   Estimated Creatinine Clearance: 34.3 mL/min (by C-G formula based on SCr of 1.97 mg/dL (H)).  Radiologic Imaging: Ct Abdomen Pelvis Wo Contrast  Result Date: 08/07/2016 CLINICAL DATA:  Urinary retention for 2 weeks EXAM: CT ABDOMEN AND PELVIS WITHOUT CONTRAST TECHNIQUE: Multidetector CT imaging of the abdomen and pelvis was performed following the standard protocol without IV contrast. COMPARISON:  None. FINDINGS: Lower chest: No acute abnormality. Hepatobiliary: No focal liver abnormality is seen. No gallstones, gallbladder wall thickening, or biliary dilatation. Pancreas: Unremarkable. No pancreatic ductal dilatation or surrounding inflammatory changes. Spleen: Normal in size without focal abnormality. Adrenals/Urinary Tract: Both adrenals are normal. No significant renal parenchymal lesions. There is marked hydronephrosis and ureteral dilatation bilaterally. The urinary bladder is markedly irregular and contains a mass, blood clot or both. The mass/clot may be as large as 7 cm. Stomach/Bowel: Small hiatal hernia. Stomach and small bowel are otherwise unremarkable. Appendix is normal. Colon is remarkable only for mild uncomplicated diverticulosis. Vascular/Lymphatic: The abdominal aorta is normal in caliber with extensive atherosclerotic calcification. The major branches of the aorta are also heavily calcified. There is a femoral-femoral graft, the patency of which cannot be determined without  intravenous contrast. There is fluid around the graft and there also is a fluid collection in the left inguinal region. No pathologic adenopathy is evident in the abdomen or pelvis. Reproductive: New unremarkable Other: No ascites. Musculoskeletal: No significant skeletal lesions.  IMPRESSION: 1. Abnormal soft tissue within the urinary bladder lumen consistent with mass or clot or both. 2. Marked hydronephrosis and hydroureter bilaterally. 3. Extensive atherosclerotic calcification throughout the normal caliber aorta and its major branches. 4. Fluid surrounding the femoral-femoral graft. Fluid collections in the inguinal regions near the graft anastomoses, left larger than right. 5. Hiatal hernia 6. Diverticulosis Electronically Signed   By: Andreas Newport M.D.   On: 08/07/2016 21:21   Dg Chest 2 View  Result Date: 08/07/2016 CLINICAL DATA:  61 y/o  M; shortness of breath.  COPD. EXAM: CHEST  2 VIEW COMPARISON:  01/01/2011 chest radiograph. FINDINGS: Stable normal cardiac silhouette. Emphysema and lung hyperinflation compatible with COPD is stable. No consolidation, effusion, or pneumothorax. Mild multilevel degenerative changes of the thoracic spine and lower thoracic dextrocurvature. IMPRESSION: Findings of COPD and emphysema.  No acute cardiopulmonary process. Electronically Signed   By: Kristine Garbe M.D.   On: 08/07/2016 20:53     Impression/Recommendation  1 - Bilateral hydroureteronephrosis with bladder mass +/- clot causing ARF - Urine clear. I reviewed imaging with a colleague and it would be difficult to find the ureteral orifices with a mass covering the UO's. By the look of his urine and CT, I suspect that's a large mass in the bladder.   Given his frailty it's going to be better to get the kidneys decompressed and allow him to recover before what might be a difficult TURBT (and one that might need to be staged). In a couple of weeks, I can take him for cystoscopy possible clot  evac, possible TURBT which would be done once he's stable in an outpatient setting.  If his kidney function recovers enough, we may be able to get a scan with contrast.   Continue foley drainage.  F/u urine culture. NPO after MN for bilateral nephrostomy tubes. I reviewed CT with Dr. Anselm Pancoast. It's OK for pt to stay on ASA. I did hold the Lovenox.   Will follow.   DANCY, AMANDA 08/08/2016, 4:17 PM   Pt was seen and examined on rounds. I discussed patient with PA Dancy and agree with the above assessment and plan.

## 2016-08-09 ENCOUNTER — Inpatient Hospital Stay (HOSPITAL_COMMUNITY): Payer: Medicare Other

## 2016-08-09 ENCOUNTER — Encounter (HOSPITAL_COMMUNITY)
Admission: EM | Disposition: A | Discharge status: Home / Self Care | Payer: Self-pay | Source: Home / Self Care | Attending: Family Medicine

## 2016-08-09 ENCOUNTER — Encounter (HOSPITAL_COMMUNITY): Payer: Self-pay | Admitting: Interventional Radiology

## 2016-08-09 DIAGNOSIS — Q6211 Congenital occlusion of ureteropelvic junction: Secondary | ICD-10-CM

## 2016-08-09 DIAGNOSIS — N1330 Unspecified hydronephrosis: Secondary | ICD-10-CM

## 2016-08-09 HISTORY — PX: IR GENERIC HISTORICAL: IMG1180011

## 2016-08-09 LAB — URINE CULTURE: Culture: NO GROWTH

## 2016-08-09 LAB — BASIC METABOLIC PANEL
ANION GAP: 6 (ref 5–15)
BUN: 18 mg/dL (ref 6–20)
CALCIUM: 8.8 mg/dL — AB (ref 8.9–10.3)
CO2: 24 mmol/L (ref 22–32)
CREATININE: 1.63 mg/dL — AB (ref 0.61–1.24)
Chloride: 105 mmol/L (ref 101–111)
GFR calc non Af Amer: 44 mL/min — ABNORMAL LOW (ref >60)
GFR, EST AFRICAN AMERICAN: 51 mL/min — AB (ref >60)
Glucose, Bld: 106 mg/dL — ABNORMAL HIGH (ref 65–99)
Potassium: 4.4 mmol/L (ref 3.5–5.1)
SODIUM: 135 mmol/L (ref 135–145)

## 2016-08-09 LAB — CBC
HEMATOCRIT: 32 % — AB (ref 39.0–52.0)
HEMOGLOBIN: 10.9 g/dL — AB (ref 13.0–17.0)
MCH: 31.9 pg (ref 26.0–34.0)
MCHC: 34.1 g/dL (ref 30.0–36.0)
MCV: 93.6 fL (ref 78.0–100.0)
Platelets: 357 10*3/uL (ref 150–400)
RBC: 3.42 MIL/uL — ABNORMAL LOW (ref 4.22–5.81)
RDW: 14.2 % (ref 11.5–15.5)
WBC: 10.7 10*3/uL — AB (ref 4.0–10.5)

## 2016-08-09 LAB — PROTIME-INR
INR: 1.02
PROTHROMBIN TIME: 13.4 s (ref 11.4–15.2)

## 2016-08-09 SURGERY — CYSTOSCOPY, WITH RETROGRADE PYELOGRAM AND URETERAL STENT INSERTION
Anesthesia: General | Laterality: Bilateral

## 2016-08-09 MED ORDER — FENTANYL CITRATE (PF) 100 MCG/2ML IJ SOLN
INTRAMUSCULAR | Status: AC
  Filled 2016-08-09: qty 4

## 2016-08-09 MED ORDER — MIDAZOLAM HCL 2 MG/2ML IJ SOLN
INTRAMUSCULAR | Status: AC
  Filled 2016-08-09: qty 4

## 2016-08-09 MED ORDER — CEFAZOLIN SODIUM-DEXTROSE 2-4 GM/100ML-% IV SOLN
2.0000 g | Freq: Once | INTRAVENOUS | Status: AC
  Administered 2016-08-09: 2 g via INTRAVENOUS

## 2016-08-09 MED ORDER — FENTANYL CITRATE (PF) 100 MCG/2ML IJ SOLN
INTRAMUSCULAR | Status: AC | PRN
  Administered 2016-08-09: 50 ug via INTRAVENOUS
  Administered 2016-08-09: 25 ug via INTRAVENOUS
  Administered 2016-08-09: 50 ug via INTRAVENOUS

## 2016-08-09 MED ORDER — LIDOCAINE HCL (PF) 1 % IJ SOLN
INTRAMUSCULAR | Status: AC
  Filled 2016-08-09: qty 30

## 2016-08-09 MED ORDER — ENOXAPARIN SODIUM 40 MG/0.4ML ~~LOC~~ SOLN
40.0000 mg | SUBCUTANEOUS | Status: DC
  Administered 2016-08-10 – 2016-08-11 (×2): 40 mg via SUBCUTANEOUS
  Filled 2016-08-09 (×3): qty 0.4

## 2016-08-09 MED ORDER — CEFAZOLIN SODIUM-DEXTROSE 2-4 GM/100ML-% IV SOLN
INTRAVENOUS | Status: AC
  Filled 2016-08-09: qty 100

## 2016-08-09 MED ORDER — MIDAZOLAM HCL 2 MG/2ML IJ SOLN
INTRAMUSCULAR | Status: AC | PRN
  Administered 2016-08-09: 1 mg via INTRAVENOUS
  Administered 2016-08-09: 0.5 mg via INTRAVENOUS
  Administered 2016-08-09: 1 mg via INTRAVENOUS

## 2016-08-09 MED ORDER — IOPAMIDOL (ISOVUE-300) INJECTION 61%
INTRAVENOUS | Status: AC
  Administered 2016-08-09: 15 mL
  Filled 2016-08-09: qty 50

## 2016-08-09 MED ORDER — CIPROFLOXACIN IN D5W 400 MG/200ML IV SOLN
400.0000 mg | Freq: Once | INTRAVENOUS | Status: DC
  Filled 2016-08-09: qty 200

## 2016-08-09 MED ORDER — HYDROCODONE-ACETAMINOPHEN 5-325 MG PO TABS: 1.0000 | ORAL_TABLET | ORAL | Status: DC | PRN

## 2016-08-09 MED ORDER — LIDOCAINE HCL (PF) 1 % IJ SOLN
INTRAMUSCULAR | Status: AC | PRN
  Administered 2016-08-09: 10 mL

## 2016-08-09 NOTE — Sedation Documentation (Signed)
Patient is resting comfortably. 

## 2016-08-09 NOTE — Sedation Documentation (Signed)
Pt grimacing 

## 2016-08-09 NOTE — Sedation Documentation (Signed)
Pt is awake, grimacing in pain

## 2016-08-09 NOTE — Procedures (Signed)
Interventional Radiology Procedure Note  Procedure:  Placement of a 90F LEFT percutaneous nephrostomy tube for obstructing bladder mass.  There was no RIGHT sided hydronephrosis and renal function is improving so placement of a Right PCN was deferred.   Complications: None  Estimated Blood Loss: None  Recommendations:  - Record out-put, monitor renal function.   Signed,  Criselda Peaches, MD

## 2016-08-09 NOTE — Progress Notes (Signed)
Family Medicine Teaching Service Daily Progress Note Intern Pager: [phone]  Patient name: Eric Padilla Medical record number: [id number] Date of birth: Jul 23, 1955 Age: 61 y.o. Gender: male  Primary Care Provider: Junie Panning, DO Consultants: Urology Code Status: Full code  Pt Overview and Major Events to Date:  1. Admit to FMTS  Assessment and Plan: Eric Padilla is a 61 y.o. male presenting with urinary retention. PMH is significant for HTN, HLD, and PAD.   Urinary Retention: Hydronephrosis and hydroureter bilaterally seen on CT abdomen. Enlarged prostate and recent normal PSA. Coude catheter placed in ED due to inability to advance foley catheter beyond prostate.  Denies abd pain.  -catheter in place  -monitor UOP -1500 daily total -continue flomax - Urology consulted --> bilateral nephrostomy tube placement today.  Patient is NPO for procedure.  In a couple weeks cystoscopy for possible TURBT.  Given his frailty, would like to decompress his kidneys and allow adequate recovery prior to cystoscopy.  Continue Foley, continue ASA, hold Lovenox.    UTI, improving: Endorsing dysuria and increased frequency, along with UA consistent with UTI and TNTC RBCs. Afebrile and no CVA tenderness. Lactic acidosis to 2.97 trended down to 1.55.  -Ucx no growth and blood cx NG2D   -White count improved from 17>10.7 this AM.  -continue CTX (Day 3)  -low threshold to broaden antibiotics if worsens   AKI, improving. Cr 2.38 at admission.  Worsening of Cr likely due to post-renal obstructive process.  -Foley to decompress bladder.  Bilateral nephrostomy tube placement today.   - f/u UOP -hold home Losartan  Weight Loss:  -patient will need outpatient colonoscopy given history of colonoscopy in 2015 with 2 tubular adenomas and 1 tubulovillous adenoma with recommended f/u in 1 year that patient was unable to complete  -HIV negative  Hypokalemia, resolved .  K+ 4.4 this AM  -hold home HCTZ  -AM  BMP check  HTN: BPs stable at admission.  BP this AM 138/72.  -continue Metoprolol  -holding home Valsartan and HCTZ   PAD/HLD: Right to left femoral-femoral bypass graft, right lower extremity femoral-popliteal artery bypass graft with vein and right popliteal endarterectomy performed 01/07/2011. -continue ASA and statin   Alcohol Abuse: Patient endorses drinking 1-2 drinks per night. From PCP note, patient has reported drinking 3-4 drinks nightly recently. Patient denies h/o EtoH withdrawal.  -CIWA scores have remained 0.  Will discontinue CIWA this AM.  -thiamine, folate, MVI     FEN/GI: NPO Prophylaxis: hold Lovenox for procedure    Disposition: Dispo pending clinical improvement.  Patient to undergo procedure today for bilateral nephrostomy tubes.   Subjective:  Feels well this morning. Reports some burning likely 2/2 to coude catheter.  No other complaints at this time.   Objective: Temp:  [97.8 F (36.6 C)-98.6 F (37 C)] 97.8 F (36.6 C) (03/09 1309) Pulse Rate:  [91-100] 91 (03/09 1309) Resp:  [18] 18 (03/09 1309) BP: (112-138)/(57-101) 129/78 (03/09 1309) SpO2:  [97 %-98 %] 97 % (03/09 1309)   Physical Exam: General: 61yo M laying in bed, in NAD  Cardiovascular: RRR, no MRG   Respiratory: cta b.l, no increased work of breathing  Abdomen: soft, NTND, no palpable masses Extremities: warm and well perfused Psych: mood an  Laboratory:  Recent Labs Lab 08/07/16 1810 08/08/16 0338 08/09/16 0900  WBC 17.0* 17.0* 10.7*  HGB 13.4 11.9* 10.9*  HCT 37.6* 34.9* 32.0*  PLT 448* 396 357    Recent Labs Lab 08/07/16 1809  08/08/16 0338 08/09/16 0900  NA 133* 136 135  K 2.9* 3.0* 4.4  CL 94* 101 105  CO2 26 26 24   BUN 33* 28* 18  CREATININE 2.38* 1.97* 1.63*  CALCIUM 9.8 8.6* 8.8*  PROT 6.9  --   --   BILITOT 0.8  --   --   ALKPHOS 143*  --   --   ALT 11*  --   --   AST 18  --   --   GLUCOSE 127* 121* 106*   Imaging/Diagnostic Tests: Ct Abdomen  Pelvis Wo Contrast  Result Date: 08/07/2016 CLINICAL DATA:  Urinary retention for 2 weeks EXAM: CT ABDOMEN AND PELVIS WITHOUT CONTRAST TECHNIQUE: Multidetector CT imaging of the abdomen and pelvis was performed following the standard protocol without IV contrast. COMPARISON:  None. FINDINGS: Lower chest: No acute abnormality. Hepatobiliary: No focal liver abnormality is seen. No gallstones, gallbladder wall thickening, or biliary dilatation. Pancreas: Unremarkable. No pancreatic ductal dilatation or surrounding inflammatory changes. Spleen: Normal in size without focal abnormality. Adrenals/Urinary Tract: Both adrenals are normal. No significant renal parenchymal lesions. There is marked hydronephrosis and ureteral dilatation bilaterally. The urinary bladder is markedly irregular and contains a mass, blood clot or both. The mass/clot may be as large as 7 cm. Stomach/Bowel: Small hiatal hernia. Stomach and small bowel are otherwise unremarkable. Appendix is normal. Colon is remarkable only for mild uncomplicated diverticulosis. Vascular/Lymphatic: The abdominal aorta is normal in caliber with extensive atherosclerotic calcification. The major branches of the aorta are also heavily calcified. There is a femoral-femoral graft, the patency of which cannot be determined without intravenous contrast. There is fluid around the graft and there also is a fluid collection in the left inguinal region. No pathologic adenopathy is evident in the abdomen or pelvis. Reproductive: New unremarkable Other: No ascites. Musculoskeletal: No significant skeletal lesions. IMPRESSION: 1. Abnormal soft tissue within the urinary bladder lumen consistent with mass or clot or both. 2. Marked hydronephrosis and hydroureter bilaterally. 3. Extensive atherosclerotic calcification throughout the normal caliber aorta and its major branches. 4. Fluid surrounding the femoral-femoral graft. Fluid collections in the inguinal regions near the graft  anastomoses, left larger than right. 5. Hiatal hernia 6. Diverticulosis Electronically Signed   By: Andreas Newport M.D.   On: 08/07/2016 21:21   Dg Chest 2 View  Result Date: 08/07/2016 CLINICAL DATA:  61 y/o  M; shortness of breath.  COPD. EXAM: CHEST  2 VIEW COMPARISON:  01/01/2011 chest radiograph. FINDINGS: Stable normal cardiac silhouette. Emphysema and lung hyperinflation compatible with COPD is stable. No consolidation, effusion, or pneumothorax. Mild multilevel degenerative changes of the thoracic spine and lower thoracic dextrocurvature. IMPRESSION: Findings of COPD and emphysema.  No acute cardiopulmonary process. Electronically Signed   By: Kristine Garbe M.D.   On: 08/07/2016 20:53    Lovenia Kim, MD 08/09/2016, 1:12 PM PGY-1, [place] Intern pager: [phone], text pages welcome

## 2016-08-09 NOTE — Consult Note (Signed)
Chief Complaint: Patient was seen in consultation today for urinary retention  Referring Physician(s): Dr. Festus Aloe  Supervising Physician: Jacqulynn Cadet  Patient Status: [facility] - In-pt  History of Present Illness: Eric Padilla is a 61 y.o. male with past medical history of HTN, HLD, PAD and carotid artery occlusion presents to [facility] with complaint of urinary retention.  Patient states he has had dysuria and inability to urinate as well as >20 lbs unintentional weight loss.    CT Abd/Pelvis shows: 1. Abnormal soft tissue within the urinary bladder lumen consistent with mass or clot or both. 2. Marked hydronephrosis and hydroureter bilaterally. 3. Extensive atherosclerotic calcification throughout the normal caliber aorta and its major branches. 4. Fluid surrounding the femoral-femoral graft. Fluid collections in the inguinal regions near the graft anastomoses, left larger than right. 5. Hiatal hernia 6. Diverticulosis  IR consulted for bilateral percutaneous nephrostomy tubes at the request of Dr. Festus Aloe.   Case reviewed by Dr. Laurence Ferrari who feels patient is appropriate for procedure but notes hydronephrosis is less on the right.   Patient has been NPO.  His lovenox was held overnight.     Past Medical History:  Diagnosis Date  . Carotid artery occlusion   . Foot pain 11/22/2010   while lying flat  . Hyperlipidemia   . Hypertension   . Leg pain    with walking  . Peripheral arterial disease [facility])     Past Surgical History:  Procedure Laterality Date  . COLONOSCOPY  May 2015  . PR VEIN BYPASS GRAFT,AORTO-FEM-POP  01/07/11   (L)-(R) fem-fem; (R) fem-AK pop BP    Allergies: Patient has no known allergies.  Medications: Prior to Admission medications   Medication Sig Start Date End Date Taking? Authorizing Provider  aspirin EC 81 MG tablet Take 1 tablet (81 mg total) by mouth daily. 12/22/14  Yes Patrecia Pour, MD  atorvastatin (LIPITOR) 40 MG  tablet Take 1 tablet (40 mg total) by mouth daily. 04/15/16  Yes [place] N Rumley, DO  hydrochlorothiazide (HYDRODIURIL) 50 MG tablet Take 1 tablet (50 mg total) by mouth daily. 06/10/16  Yes [place] N Rumley, DO  losartan (COZAAR) 50 MG tablet TAKE 1 TABLET BY MOUTH EVERY DAY 11/07/15  Yes Patrecia Pour, MD  metoprolol succinate (TOPROL-XL) 100 MG 24 hr tablet Take 1 tablet (100 mg total) by mouth daily. Take with or immediately following a meal. 06/18/16  Yes Lorna Few, DO     Family History  Problem Relation Age of Onset  . Stroke Mother   . Heart disease Father   . Breast cancer Sister     Social History   Social History  . Marital status: Divorced    Spouse name: N/A  . Number of children: N/A  . Years of education: N/A   Social History Main Topics  . Smoking status: Former Smoker    Packs/day: 0.15    Years: 20.00    Types: Cigarettes    Start date: 03/30/2014    Quit date: 01/27/2015  . Smokeless tobacco: Never Used  . Alcohol use 7.2 - 8.4 oz/week    12 - 14 Standard drinks or equivalent per week  . Drug use: No  . Sexual activity: Not Asked   Other Topics Concern  . None   Social History Narrative  . None     Review of Systems  Constitutional: Positive for fatigue and unexpected weight change. Negative for fever.  Respiratory: Negative for  cough and shortness of breath.   Cardiovascular: Negative for chest pain.  Genitourinary: Positive for decreased urine volume, difficulty urinating and dysuria.  Psychiatric/Behavioral: Negative for behavioral problems and confusion.    Vital Signs: BP 138/72 (BP Location: Right Wrist)   Pulse 98   Temp 98.6 F (37 C) (Oral)   Resp 18   Ht 5\' 11"  (1.803 m)   Wt 137 lb 6.4 oz (62.3 kg)   SpO2 98%   BMI 19.16 kg/m   Physical Exam  Constitutional: He is oriented to person, place, and time. He appears well-developed. No distress.  Severe muscle and fat wasting noted  Cardiovascular: Normal rate, regular rhythm  and normal heart sounds.   Pulmonary/Chest: Effort normal and breath sounds normal. No respiratory distress.  Abdominal: Soft.  Neurological: He is alert and oriented to person, place, and time.  Skin: Skin is warm and dry.  Psychiatric: He has a normal mood and affect. His behavior is normal. Judgment and thought content normal.  Nursing note and vitals reviewed.   Mallampati Score:  MD Evaluation Airway: WNL Heart: WNL Abdomen: WNL Chest/ Lungs: WNL ASA  Classification: 3 Mallampati/Airway Score: One  Imaging: Ct Abdomen Pelvis Wo Contrast  Result Date: 08/07/2016 CLINICAL DATA:  Urinary retention for 2 weeks EXAM: CT ABDOMEN AND PELVIS WITHOUT CONTRAST TECHNIQUE: Multidetector CT imaging of the abdomen and pelvis was performed following the standard protocol without IV contrast. COMPARISON:  None. FINDINGS: Lower chest: No acute abnormality. Hepatobiliary: No focal liver abnormality is seen. No gallstones, gallbladder wall thickening, or biliary dilatation. Pancreas: Unremarkable. No pancreatic ductal dilatation or surrounding inflammatory changes. Spleen: Normal in size without focal abnormality. Adrenals/Urinary Tract: Both adrenals are normal. No significant renal parenchymal lesions. There is marked hydronephrosis and ureteral dilatation bilaterally. The urinary bladder is markedly irregular and contains a mass, blood clot or both. The mass/clot may be as large as 7 cm. Stomach/Bowel: Small hiatal hernia. Stomach and small bowel are otherwise unremarkable. Appendix is normal. Colon is remarkable only for mild uncomplicated diverticulosis. Vascular/Lymphatic: The abdominal aorta is normal in caliber with extensive atherosclerotic calcification. The major branches of the aorta are also heavily calcified. There is a femoral-femoral graft, the patency of which cannot be determined without intravenous contrast. There is fluid around the graft and there also is a fluid collection in the left  inguinal region. No pathologic adenopathy is evident in the abdomen or pelvis. Reproductive: New unremarkable Other: No ascites. Musculoskeletal: No significant skeletal lesions. IMPRESSION: 1. Abnormal soft tissue within the urinary bladder lumen consistent with mass or clot or both. 2. Marked hydronephrosis and hydroureter bilaterally. 3. Extensive atherosclerotic calcification throughout the normal caliber aorta and its major branches. 4. Fluid surrounding the femoral-femoral graft. Fluid collections in the inguinal regions near the graft anastomoses, left larger than right. 5. Hiatal hernia 6. Diverticulosis Electronically Signed   By: Andreas Newport M.D.   On: 08/07/2016 21:21   Dg Chest 2 View  Result Date: 08/07/2016 CLINICAL DATA:  61 y/o  M; shortness of breath.  COPD. EXAM: CHEST  2 VIEW COMPARISON:  01/01/2011 chest radiograph. FINDINGS: Stable normal cardiac silhouette. Emphysema and lung hyperinflation compatible with COPD is stable. No consolidation, effusion, or pneumothorax. Mild multilevel degenerative changes of the thoracic spine and lower thoracic dextrocurvature. IMPRESSION: Findings of COPD and emphysema.  No acute cardiopulmonary process. Electronically Signed   By: Kristine Garbe M.D.   On: 08/07/2016 20:53    Labs:  CBC:  Recent Labs  07/18/16 1047 08/07/16 1810 08/08/16 0338  WBC 13.2* 17.0* 17.0*  HGB  --  13.4 11.9*  HCT 38.5 37.6* 34.9*  PLT 460* 448* 396    COAGS: No results for input(s): INR, APTT in the last 8760 hours.  BMP:  Recent Labs  06/18/16 1020 07/05/16 1226 08/07/16 1809 08/08/16 0338  NA 132* 130* 133* 136  K 4.5 4.9 2.9* 3.0*  CL 88* 92* 94* 101  CO2 22 25 26 26   GLUCOSE 110* 112* 127* 121*  BUN 29* 26 33* 28*  CALCIUM 10.2 10.4* 9.8 8.6*  CREATININE 2.18* 1.92* 2.38* 1.97*  GFRNONAA 31* 36* 28* 35*  GFRAA 36* 42* 32* 40*    LIVER FUNCTION TESTS:  Recent Labs  06/18/16 1020 07/05/16 1226 08/07/16 1809  BILITOT  0.5 0.6 0.8  AST 14 11 18   ALT 5 7 11*  ALKPHOS 162* 181* 143*  PROT 7.5 7.4 6.9  ALBUMIN 4.3 4.3 3.2*    TUMOR MARKERS: No results for input(s): AFPTM, CEA, CA199, CHROMGRNA in the last 8760 hours.  Assessment and Plan: Bilateral hydronephrosis, urinary retention Patient admitted with urinary retention, suspected bladder mass.  CT Abd/Pelvis shows bilateral hydronephrosis.  IR consulted for bilateral percutaneous nephrostomy tube placement.  Case reviewed by Dr. Laurence Ferrari who approves patient for bilateral nephrostomy tubes, but notes right hydronephrosis is smaller in size.  Risks and Benefits discussed with the patient including, but not limited to infection, bleeding, significant bleeding causing loss or decrease in renal function or damage to adjacent structures.  All of the patient's questions were answered, patient is agreeable to proceed. Consent signed and in chart. Note WBC of 17.0 yesterday.  Having ordered repeat labwork including PT-INR.   Thank you for this interesting consult.  I greatly enjoyed meeting [facility] and look forward to participating in their care.  A copy of this report was sent to the requesting provider on this date.  Electronically Signed: Docia Barrier 08/09/2016, 10:15 AM   I spent a total of 40 Minutes    in face to face in clinical consultation, greater than 50% of which was counseling/coordinating care for bilateral hydronephrosis.

## 2016-08-10 DIAGNOSIS — Q620 Congenital hydronephrosis: Secondary | ICD-10-CM

## 2016-08-10 LAB — CBC
HEMATOCRIT: 31.9 % — AB (ref 39.0–52.0)
Hemoglobin: 10.7 g/dL — ABNORMAL LOW (ref 13.0–17.0)
MCH: 31.3 pg (ref 26.0–34.0)
MCHC: 33.5 g/dL (ref 30.0–36.0)
MCV: 93.3 fL (ref 78.0–100.0)
PLATELETS: 364 10*3/uL (ref 150–400)
RBC: 3.42 MIL/uL — ABNORMAL LOW (ref 4.22–5.81)
RDW: 14.4 % (ref 11.5–15.5)
WBC: 11.2 10*3/uL — ABNORMAL HIGH (ref 4.0–10.5)

## 2016-08-10 LAB — BASIC METABOLIC PANEL
Anion gap: 9 (ref 5–15)
BUN: 15 mg/dL (ref 6–20)
CALCIUM: 9 mg/dL (ref 8.9–10.3)
CO2: 27 mmol/L (ref 22–32)
Chloride: 98 mmol/L — ABNORMAL LOW (ref 101–111)
Creatinine, Ser: 1.51 mg/dL — ABNORMAL HIGH (ref 0.61–1.24)
GFR calc Af Amer: 55 mL/min — ABNORMAL LOW (ref >60)
GFR, EST NON AFRICAN AMERICAN: 48 mL/min — AB (ref >60)
Glucose, Bld: 106 mg/dL — ABNORMAL HIGH (ref 65–99)
POTASSIUM: 4.7 mmol/L (ref 3.5–5.1)
Sodium: 134 mmol/L — ABNORMAL LOW (ref 135–145)

## 2016-08-10 NOTE — Progress Notes (Signed)
Family Medicine Teaching Service Daily Progress Note Intern Pager: [phone]  Patient name: Eric Padilla Medical record number: [id number] Date of birth: [birth year] Age: 61 y.o. Gender: male  Primary Care Provider: Junie Panning, DO Consultants: Urology  Code Status: Full code   Pt Overview and Major Events to Date:    Assessment and Plan:  Eric Padilla is a 61 y.o. male presenting with urinary retention. PMH is significant for HTN, HLD, and PAD.   Urinary Retention: Hydronephrosis and hydroureter bilaterally seen on CT abdomen. Enlarged prostate and recent normal PSA. Coude catheter placed in ED due to inability to advance foley catheter beyond prostate.  Denies abd pain.  -catheter in place , UOP -1605 daily total  -continue flomax  - Urology consulted, appreciate recs--> bilateral nephrostomy tube placement on 3/9. S/p Left percutaneous nephrostomy tube for obstructing bladder mass.  There was no R-sided hydronephrosis and renal function improving so placement of R PCN was deferred. Tolerated procedure well.  Resumed regular diet.  Per urology recs, in a couple weeks cystoscopy for possible TURBT.  Given his frailty, would like to decompress his kidneys this admission and allow adequate recovery prior to cystoscopy.  Resumed lovenox.  -Follow up recs: monitor UOP, renal function. Cr this AM 1.55.   UTI, improved:  Afebrile and no CVA tenderness. Lactic acidosis to 2.97 trended down to 1.55. Ucx no growth and blood cx NG2D. White count improved from 17>10.7>11.2.  -CTX was discontinued after 4 days but will not need to restart given afebrile, wbc improving and no longer c/o burning. Discomfort likely 2/2 irritation from cath.  -low threshold to broaden antibiotics if worsens   AKI, improving. Cr 2.38 at admission.  Worsening of Cr likely due to post-renal obstructive process.  Improved to 1.55.  -Foley to decompress bladder and patient is s/p L percutaneous nephrostomy tube.  - f/u UOP   -hold home Losartan  Weight Loss: HIV screen negative.  -patient will need outpatient colonoscopy given history of colonoscopy in 2015 with 2 tubular adenomas and 1 tubulovillous adenoma with recommended f/u in 1 year that patient was unable to complete  -Urology will perform cystoscopy in 1-2 weeks following discharge to evaluate   Hypokalemia, resolved .  K+ 4.7 this AM  -hold home HCTZ  -AM BMP check  HTN: BPs stable at admission.  BP this AM 138/72.  -continue Metoprolol  -holding home Valsartan and HCTZ   PAD/HLD: Right to left femoral-femoral bypass graft, right lower extremity femoral-popliteal artery bypass graft with vein and right popliteal endarterectomy performed 01/07/2011. -continue ASA and statin   Alcohol Abuse: Patient endorses drinking 1-2 drinks per night. From PCP note, patient has reported drinking 3-4 drinks nightly recently. Patient denies h/o EtoH withdrawal.  -CIWA scores had remained 0's and CIWA protocol discontinued.  -thiamine, folate, MVI    FEN/GI: Regular diet  Prophylaxis: Lovenox  Disposition: Patient s/p  bilateral nephrostomy tubes.  Will monitor inpatient for UOP and kidney function.   Subjective:  Feels well this morning. Reports mild discomfort 2/2 to coude catheter but no other complaints at this time.   Objective: Temp:  [97.6 F (36.4 C)-98.2 F (36.8 C)] 98.2 F (36.8 C) (03/10 0440) Pulse Rate:  [84-100] 90 (03/10 0440) Resp:  [12-29] 20 (03/10 0440) BP: (129-147)/(60-83) 134/73 (03/10 0440) SpO2:  [95 %-100 %] 95 % (03/10 0440)   Physical Exam: General: 61yo M laying in bed, in NAD  Cardiovascular: RRR, no MRG  Noted  Respiratory: cta  b.l, no increased work of breathing on RA Abdomen: soft, NTND, no palpable masses Extremities: warm and well perfused Psych: normal mood and affect   Laboratory:  Recent Labs Lab 08/08/16 0338 08/09/16 0900 08/10/16 0535  WBC 17.0* 10.7* 11.2*  HGB 11.9* 10.9* 10.7*  HCT 34.9*  32.0* 31.9*  PLT 396 357 364    Recent Labs Lab 08/07/16 1809 08/08/16 0338 08/09/16 0900 08/10/16 0535  NA 133* 136 135 134*  K 2.9* 3.0* 4.4 4.7  CL 94* 101 105 98*  CO2 26 26 24 27   BUN 33* 28* 18 15  CREATININE 2.38* 1.97* 1.63* 1.51*  CALCIUM 9.8 8.6* 8.8* 9.0  PROT 6.9  --   --   --   BILITOT 0.8  --   --   --   ALKPHOS 143*  --   --   --   ALT 11*  --   --   --   AST 18  --   --   --   GLUCOSE 127* 121* 106* 106*   Imaging/Diagnostic Tests: Ir Nephrostomy Placement Left  Result Date: 08/09/2016 INDICATION: 61 year old male with new diagnosis of bladder mass, left-sided hydronephrosis and obstructed uropathy. His renal function is slightly improved today but remains elevated consistent with acute kidney injury. EXAM: IR NEPHROSTOMY PLACEMENT LEFT COMPARISON:  CT scan of the abdomen and pelvis 08/08/2026 MEDICATIONS: 2 g Ancef; The antibiotic was administered in an appropriate time frame prior to skin puncture. ANESTHESIA/SEDATION: Fentanyl 125 mcg IV; Versed 2.5 mg IV Moderate Sedation Time:  20 minutes The patient was continuously monitored during the procedure by the interventional radiology nurse under my direct supervision. CONTRAST:  7mL ISOVUE-300 IOPAMIDOL (ISOVUE-300) INJECTION 61% - administered into the collecting system(s) FLUOROSCOPY TIME:  Fluoroscopy Time: 3 minutes 42 seconds (9 mGy). COMPLICATIONS: None immediate. TECHNIQUE: The procedure, risks, benefits, and alternatives were explained to the patient. Questions regarding the procedure were encouraged and answered. The patient understands and consents to the procedure. The left flank was prepped with chlorhexidine in a sterile fashion, and a sterile drape was applied covering the operative field. A sterile gown and sterile gloves were used for the procedure. Local anesthesia was provided with 1% Lidocaine. The left flank was interrogated with ultrasound and the left kidney identified. The kidney is hydronephrotic. A  suitable access site on the skin overlying the lower pole, posterior calix was identified. After local mg anesthesia was achieved, a small skin nick was made with an 11 blade scalpel. A 21 gauge Accustick needle was then advanced under direct sonographic guidance into the lower pole of the left kidney. A 0.018 inch wire was advanced under fluoroscopic guidance into the left renal collecting system. The Accustick sheath was then advanced over the wire and a 0.018 system exchanged for a 0.035 system. Gentle hand injection of contrast material confirms placement of the sheath within the renal collecting system. There is hydronephrosis and distal ureteral obstruction. The tract from the scan into the renal collecting system was then dilated serially to 10-French. A 10-French Cook all-purpose drain was then placed and positioned under fluoroscopic guidance. The locking loop is well formed within the left renal pelvis. The catheter was secured to the skin with 2-0 Prolene and a sterile bandage was placed. Catheter was left to gravity bag drainage. The right flank was then interrogated with ultrasound at the right kidney identified. There is no right-sided hydronephrosis. Placement of a right-sided percutaneous nephrostomy tube was therefore deferred. IMPRESSION: Successful placement of  a left 10 French percutaneous nephrostomy tube. Ultrasound evaluation of the right kidney demonstrates no hydronephrosis. In light of the patient's slightly improved renal function today, right-sided nephrostomy tube placement was deferred. Signed, Criselda Peaches, MD [facility] Radiology Electronically Signed   By: Jacqulynn Cadet M.D.   On: 08/09/2016 17:31    Lovenia Kim, MD 08/10/2016, 11:40 AM PGY-1, [place] Intern pager: [phone], text pages welcome

## 2016-08-10 NOTE — Consult Note (Signed)
UROLOGY Consult Note  Assessment/Plan: Eric Padilla is a 61 y.o. male with a history of HLD, HTN, PAD s/p left fem-fem bypass graft 2012 admitted 08/07/16 for urinary retention with abnormal appearing CT suggestive of bladder tumor. Foley catheter was placed. He has been on CTX for concern of UTI, cultures showing NGTD. On admission he had AKI to 2.38. Former smoker. CT A/P with bilateral hydro, L > R, complex mass in bladder (noncon). CXR with COPD/emphysema but no evidence of mets of any kind. Has BPH as well.  Interval/Plan: S/p left PCN placement yesterday, right PCN deferred as no hydronephrosis present. AFVSS. 1.6 UOP, 105cc total out of L PCN since placement. Cr 1.5 from 1.63. Hb stable. Urine in PCN is watermelon colored, Foley is yellow.    Continue Foley & left PCN to straight drainage  Continue Flomax  Flush left PCN qd and PRN with 10cc normal saline to maintain patency  Consider TOV tomorrow morning if renal function continues to improve. Check BMP tomorrow AM  TURBT as outpatient in a 1-2 weeks with Dr. Junious Silk  Plan to discharge with left PCN to straight drainage, ideally no Foley catheter required   Subjective: Doing well. No pain, n/v, fever. Tolerating diet. No significant catheter or PCN discomfort.   Objective:  Vital signs in last 24 hours: Temp:  [97.6 F (36.4 C)-98.2 F (36.8 C)] 98.2 F (36.8 C) (03/10 0440) Pulse Rate:  [84-100] 90 (03/10 0440) Resp:  [12-29] 20 (03/10 0440) BP: (129-147)/(60-83) 134/73 (03/10 0440) SpO2:  [95 %-100 %] 95 % (03/10 0440)  03/09 0701 - 03/10 0700 In: 118 [P.O.:118] Out: 1605 [Urine:1605]    Physical Exam:  General:  thin male in NAD, lying in bed, alert & oriented, dissheveled appearance, stool visible on blankets HEENT: [place]/AT, EOMI, sclera anicteric, hearing grossly intact, no nasal discharge, MMM Respiratory: nonlabored respirations, satting well on RA, symmetrical chest rise Cardiovascular: pulse regular rate &  rhythm Abdominal: soft, NTTP, scaphoid, nondistended GU: left inguinal bulging erythematous lesion related to fem-fem BPG that is NTTP but irritated and blanches, no definite inguinal lymphadenopathy appreciated but cordlike bypass graft palpable in suprapubic area, Foley catheter draining yellow urine, left PCN draining watermelon to fruit punch colored urine Extremities: warm, well-perfused, no c/c/e   Data Review: Results for orders placed or performed during the hospital encounter of 08/07/16 (from the past 24 hour(s))  CBC     Status: Abnormal   Collection Time: 08/10/16  5:35 AM  Result Value Ref Range   WBC 11.2 (H) 4.0 - 10.5 K/uL   RBC 3.42 (L) 4.22 - 5.81 MIL/uL   Hemoglobin 10.7 (L) 13.0 - 17.0 g/dL   HCT 31.9 (L) 39.0 - 52.0 %   MCV 93.3 78.0 - 100.0 fL   MCH 31.3 26.0 - 34.0 pg   MCHC 33.5 30.0 - 36.0 g/dL   RDW 14.4 11.5 - 15.5 %   Platelets 364 150 - 400 K/uL  Basic metabolic panel     Status: Abnormal   Collection Time: 08/10/16  5:35 AM  Result Value Ref Range   Sodium 134 (L) 135 - 145 mmol/L   Potassium 4.7 3.5 - 5.1 mmol/L   Chloride 98 (L) 101 - 111 mmol/L   CO2 27 22 - 32 mmol/L   Glucose, Bld 106 (H) 65 - 99 mg/dL   BUN 15 6 - 20 mg/dL   Creatinine, Ser 1.51 (H) 0.61 - 1.24 mg/dL   Calcium 9.0 8.9 - 10.3 mg/dL  GFR calc non Af Amer 48 (L) >60 mL/min   GFR calc Af Amer 55 (L) >60 mL/min   Anion gap 9 5 - 15    Imaging: CT A/P, IR left PCN and CXR imaging personally reviewed

## 2016-08-10 NOTE — Progress Notes (Signed)
Referring Physician(s): Eskridge,M  Supervising Physician: Arne Cleveland  Patient Status:  [facility] - In-pt  Chief Complaint:  Left hydronephrosis, bladder mass, renal insufficiency  Subjective: Pt doing fairly well; denies sig flank pain,N/V , resp difficulties   Allergies: Patient has no known allergies.  Medications: Prior to Admission medications   Medication Sig Start Date End Date Taking? Authorizing Provider  aspirin EC 81 MG tablet Take 1 tablet (81 mg total) by mouth daily. 12/22/14  Yes Patrecia Pour, MD  atorvastatin (LIPITOR) 40 MG tablet Take 1 tablet (40 mg total) by mouth daily. 04/15/16  Yes Almira N Rumley, DO  hydrochlorothiazide (HYDRODIURIL) 50 MG tablet Take 1 tablet (50 mg total) by mouth daily. 06/10/16  Yes [place] N Rumley, DO  losartan (COZAAR) 50 MG tablet TAKE 1 TABLET BY MOUTH EVERY DAY 11/07/15  Yes Patrecia Pour, MD  metoprolol succinate (TOPROL-XL) 100 MG 24 hr tablet Take 1 tablet (100 mg total) by mouth daily. Take with or immediately following a meal. 06/18/16  Yes [place] N Rumley, DO     Vital Signs: BP 134/73 (BP Location: Left Arm)   Pulse 90   Temp 98.2 F (36.8 C) (Oral)   Resp 20   Ht 5\' 11"  (1.803 m)   Wt 137 lb 6.4 oz (62.3 kg)   SpO2 95%   BMI 19.16 kg/m   Physical Exam left PCN intact, insertion site ok-mildly tender; output 125 cc blood-tinged urine; drain flushes well  Imaging: Ct Abdomen Pelvis Wo Contrast  Result Date: 08/07/2016 CLINICAL DATA:  Urinary retention for 2 weeks EXAM: CT ABDOMEN AND PELVIS WITHOUT CONTRAST TECHNIQUE: Multidetector CT imaging of the abdomen and pelvis was performed following the standard protocol without IV contrast. COMPARISON:  None. FINDINGS: Lower chest: No acute abnormality. Hepatobiliary: No focal liver abnormality is seen. No gallstones, gallbladder wall thickening, or biliary dilatation. Pancreas: Unremarkable. No pancreatic ductal dilatation or surrounding inflammatory changes. Spleen:  Normal in size without focal abnormality. Adrenals/Urinary Tract: Both adrenals are normal. No significant renal parenchymal lesions. There is marked hydronephrosis and ureteral dilatation bilaterally. The urinary bladder is markedly irregular and contains a mass, blood clot or both. The mass/clot may be as large as 7 cm. Stomach/Bowel: Small hiatal hernia. Stomach and small bowel are otherwise unremarkable. Appendix is normal. Colon is remarkable only for mild uncomplicated diverticulosis. Vascular/Lymphatic: The abdominal aorta is normal in caliber with extensive atherosclerotic calcification. The major branches of the aorta are also heavily calcified. There is a femoral-femoral graft, the patency of which cannot be determined without intravenous contrast. There is fluid around the graft and there also is a fluid collection in the left inguinal region. No pathologic adenopathy is evident in the abdomen or pelvis. Reproductive: New unremarkable Other: No ascites. Musculoskeletal: No significant skeletal lesions. IMPRESSION: 1. Abnormal soft tissue within the urinary bladder lumen consistent with mass or clot or both. 2. Marked hydronephrosis and hydroureter bilaterally. 3. Extensive atherosclerotic calcification throughout the normal caliber aorta and its major branches. 4. Fluid surrounding the femoral-femoral graft. Fluid collections in the inguinal regions near the graft anastomoses, left larger than right. 5. Hiatal hernia 6. Diverticulosis Electronically Signed   By: Andreas Newport M.D.   On: 08/07/2016 21:21   Dg Chest 2 View  Result Date: 08/07/2016 CLINICAL DATA:  61 y/o  M; shortness of breath.  COPD. EXAM: CHEST  2 VIEW COMPARISON:  01/01/2011 chest radiograph. FINDINGS: Stable normal cardiac silhouette. Emphysema and lung hyperinflation compatible with COPD  is stable. No consolidation, effusion, or pneumothorax. Mild multilevel degenerative changes of the thoracic spine and lower thoracic  dextrocurvature. IMPRESSION: Findings of COPD and emphysema.  No acute cardiopulmonary process. Electronically Signed   By: Kristine Garbe M.D.   On: 08/07/2016 20:53   Ir Nephrostomy Placement Left  Result Date: 08/09/2016 INDICATION: 61 year old male with new diagnosis of bladder mass, left-sided hydronephrosis and obstructed uropathy. His renal function is slightly improved today but remains elevated consistent with acute kidney injury. EXAM: IR NEPHROSTOMY PLACEMENT LEFT COMPARISON:  CT scan of the abdomen and pelvis 08/08/2026 MEDICATIONS: 2 g Ancef; The antibiotic was administered in an appropriate time frame prior to skin puncture. ANESTHESIA/SEDATION: Fentanyl 125 mcg IV; Versed 2.5 mg IV Moderate Sedation Time:  20 minutes The patient was continuously monitored during the procedure by the interventional radiology nurse under my direct supervision. CONTRAST:  51mL ISOVUE-300 IOPAMIDOL (ISOVUE-300) INJECTION 61% - administered into the collecting system(s) FLUOROSCOPY TIME:  Fluoroscopy Time: 3 minutes 42 seconds (9 mGy). COMPLICATIONS: None immediate. TECHNIQUE: The procedure, risks, benefits, and alternatives were explained to the patient. Questions regarding the procedure were encouraged and answered. The patient understands and consents to the procedure. The left flank was prepped with chlorhexidine in a sterile fashion, and a sterile drape was applied covering the operative field. A sterile gown and sterile gloves were used for the procedure. Local anesthesia was provided with 1% Lidocaine. The left flank was interrogated with ultrasound and the left kidney identified. The kidney is hydronephrotic. A suitable access site on the skin overlying the lower pole, posterior calix was identified. After local mg anesthesia was achieved, a small skin nick was made with an 11 blade scalpel. A 21 gauge Accustick needle was then advanced under direct sonographic guidance into the lower pole of the left  kidney. A 0.018 inch wire was advanced under fluoroscopic guidance into the left renal collecting system. The Accustick sheath was then advanced over the wire and a 0.018 system exchanged for a 0.035 system. Gentle hand injection of contrast material confirms placement of the sheath within the renal collecting system. There is hydronephrosis and distal ureteral obstruction. The tract from the scan into the renal collecting system was then dilated serially to 10-French. A 10-French Cook all-purpose drain was then placed and positioned under fluoroscopic guidance. The locking loop is well formed within the left renal pelvis. The catheter was secured to the skin with 2-0 Prolene and a sterile bandage was placed. Catheter was left to gravity bag drainage. The right flank was then interrogated with ultrasound at the right kidney identified. There is no right-sided hydronephrosis. Placement of a right-sided percutaneous nephrostomy tube was therefore deferred. IMPRESSION: Successful placement of a left 10 French percutaneous nephrostomy tube. Ultrasound evaluation of the right kidney demonstrates no hydronephrosis. In light of the patient's slightly improved renal function today, right-sided nephrostomy tube placement was deferred. Signed, Criselda Peaches, MD [facility] Radiology Electronically Signed   By: Jacqulynn Cadet M.D.   On: 08/09/2016 17:31    Labs:  CBC:  Recent Labs  08/07/16 1810 08/08/16 0338 08/09/16 0900 08/10/16 0535  WBC 17.0* 17.0* 10.7* 11.2*  HGB 13.4 11.9* 10.9* 10.7*  HCT 37.6* 34.9* 32.0* 31.9*  PLT 448* 396 357 364    COAGS:  Recent Labs  08/09/16 0900  INR 1.02    BMP:  Recent Labs  08/07/16 1809 08/08/16 0338 08/09/16 0900 08/10/16 0535  NA 133* 136 135 134*  K  2.9* 3.0* 4.4 4.7  CL 94* 101 105 98*  CO2 26 26 24 27   GLUCOSE 127* 121* 106* 106*  BUN 33* 28* 18 15  CALCIUM 9.8 8.6* 8.8* 9.0    CREATININE 2.38* 1.97* 1.63* 1.51*  GFRNONAA 28* 35* 44* 48*  GFRAA 32* 40* 51* 55*    LIVER FUNCTION TESTS:  Recent Labs  06/18/16 1020 07/05/16 1226 08/07/16 1809  BILITOT 0.5 0.6 0.8  AST 14 11 18   ALT 5 7 11*  ALKPHOS 162* 181* 143*  PROT 7.5 7.4 6.9  ALBUMIN 4.3 4.3 3.2*    Assessment and Plan:  Pt with hx bladder mass, obstructive uropathy/left hydro, renal insuff; s/p left PCN 3/9; AF; WBC 11.2; hgb stable; creat 1.51(1.63); cont current tx as per urology; send PCN urine for cx/cytology   Electronically Signed: D. Rowe Robert 08/10/2016, 12:00 PM   I spent a total of 15 minutes at the the patient's bedside AND on the patient's hospital floor or unit, greater than 50% of which was counseling/coordinating care for left nephrostomy    Patient ID: Eric Padilla, male   DOB: [birth year], 61 y.o.   MRN: [id number]

## 2016-08-11 LAB — BASIC METABOLIC PANEL
ANION GAP: 7 (ref 5–15)
BUN: 20 mg/dL (ref 6–20)
CHLORIDE: 100 mmol/L — AB (ref 101–111)
CO2: 26 mmol/L (ref 22–32)
Calcium: 8.9 mg/dL (ref 8.9–10.3)
Creatinine, Ser: 1.48 mg/dL — ABNORMAL HIGH (ref 0.61–1.24)
GFR calc non Af Amer: 49 mL/min — ABNORMAL LOW (ref >60)
GFR, EST AFRICAN AMERICAN: 57 mL/min — AB (ref >60)
Glucose, Bld: 110 mg/dL — ABNORMAL HIGH (ref 65–99)
Potassium: 3.9 mmol/L (ref 3.5–5.1)
SODIUM: 133 mmol/L — AB (ref 135–145)

## 2016-08-11 LAB — URINE CULTURE: CULTURE: NO GROWTH

## 2016-08-11 LAB — CBC
HCT: 31.2 % — ABNORMAL LOW (ref 39.0–52.0)
HEMOGLOBIN: 10.5 g/dL — AB (ref 13.0–17.0)
MCH: 31.4 pg (ref 26.0–34.0)
MCHC: 33.7 g/dL (ref 30.0–36.0)
MCV: 93.4 fL (ref 78.0–100.0)
PLATELETS: 377 10*3/uL (ref 150–400)
RBC: 3.34 MIL/uL — AB (ref 4.22–5.81)
RDW: 14.3 % (ref 11.5–15.5)
WBC: 10.1 10*3/uL (ref 4.0–10.5)

## 2016-08-11 MED ORDER — TAMSULOSIN HCL 0.4 MG PO CAPS: 0.4000 mg | ORAL_CAPSULE | Freq: Every day | ORAL | 11 refills | Status: DC

## 2016-08-11 NOTE — Consult Note (Signed)
UROLOGY Consult Note  Assessment/Plan: Eric Padilla is a 61 y.o. male with a history of HLD, HTN, PAD s/p left fem-fem bypass graft 2012 admitted 08/07/16 for urinary retention with abnormal appearing CT suggestive of bladder tumor. Foley catheter was placed. He has been on CTX for concern of UTI, cultures showing NGTD. On admission he had AKI to 2.38. Former smoker. CT A/P with bilateral hydro, L > R, complex mass in bladder (noncon). CXR with COPD/emphysema but no evidence of mets of any kind. Has BPH as well.  Interval/Plan: AFVSS. 770 UOP, 250 from Foley and 520 from L PCN. Cr 1.4 from 1.5. Hb stable. 3/7 urine culture no growth. Foley removed this morning.    TOV this morning - patient states he already voided once but flushed down toilet. I requested he void into urinal next to ensure he passed  Continue Flomax now and on discharge, please discharge with Rx for this  TURBT as outpatient in a 1-2 weeks with Dr. Junious Silk. Follow up in 1 week requested.   Plan to discharge with left PCN to straight drainage. Please ensure patient is instructed in PCN care by RN staff prior to discharge  Assuming he passes TOV with no issue would be suitable for discharge today from our perspective   Subjective: No pain, fever, n/v. No issues overnight. Wants to go home. Voided once s/p Foley removal but flushed down toilet.   Objective:  Vital signs in last 24 hours: Temp:  [98 F (36.7 C)-98.3 F (36.8 C)] 98.3 F (36.8 C) (03/11 0551) Pulse Rate:  [84-100] 84 (03/11 0551) Resp:  [17-18] 18 (03/11 0551) BP: (130-138)/(74-76) 136/76 (03/11 0551) SpO2:  [96 %-98 %] 96 % (03/11 0551)  03/10 0701 - 03/11 0700 In: 472 [P.O.:462] Out: 770 [Urine:770]    Physical Exam:  General:  thin male in NAD, lying in bed, alert & oriented, dissheveled appearance HEENT: [place]/AT, EOMI, sclera anicteric, hearing grossly intact, no nasal discharge, MMM Respiratory: nonlabored respirations, satting well on RA,  symmetrical chest rise Cardiovascular: pulse regular rate & rhythm Abdominal: soft, NTTP, scaphoid, nondistended GU: left inguinal bulging erythematous lesion related to fem-fem BPG that is NTTP but irritated and blanches, no definite inguinal lymphadenopathy appreciated but cordlike bypass graft palpable in suprapubic area, left PCN draining light watermelon colored urine Extremities: warm, well-perfused, no c/c/e   Data Review: Results for orders placed or performed during the hospital encounter of 08/07/16 (from the past 24 hour(s))  CBC     Status: Abnormal   Collection Time: 08/11/16  5:09 AM  Result Value Ref Range   WBC 10.1 4.0 - 10.5 K/uL   RBC 3.34 (L) 4.22 - 5.81 MIL/uL   Hemoglobin 10.5 (L) 13.0 - 17.0 g/dL   HCT 31.2 (L) 39.0 - 52.0 %   MCV 93.4 78.0 - 100.0 fL   MCH 31.4 26.0 - 34.0 pg   MCHC 33.7 30.0 - 36.0 g/dL   RDW 14.3 11.5 - 15.5 %   Platelets 377 150 - 400 K/uL  Basic metabolic panel     Status: Abnormal   Collection Time: 08/11/16  5:09 AM  Result Value Ref Range   Sodium 133 (L) 135 - 145 mmol/L   Potassium 3.9 3.5 - 5.1 mmol/L   Chloride 100 (L) 101 - 111 mmol/L   CO2 26 22 - 32 mmol/L   Glucose, Bld 110 (H) 65 - 99 mg/dL   BUN 20 6 - 20 mg/dL   Creatinine, Ser 1.48 (  H) 0.61 - 1.24 mg/dL   Calcium 8.9 8.9 - 10.3 mg/dL   GFR calc non Af Amer 49 (L) >60 mL/min   GFR calc Af Amer 57 (L) >60 mL/min   Anion gap 7 5 - 15    Imaging: CT A/P, IR left PCN and CXR imaging personally reviewed

## 2016-08-11 NOTE — Progress Notes (Signed)
Discharge instructions gone over with patient. Home medications gone over. Prescription was electronically sent to pharmacy. Follow up appointments to be made. Diet and reasons to call the doctor discussed. Patient verbalized understanding of instructions.

## 2016-08-11 NOTE — Discharge Instructions (Signed)
It was a pleasure taking care of you this hospitalization!   We have started a new medication called Flomax. This is to help you urinate. Please take this every day.  Your kidneys were injured when you came into the hospital. Please do not take the Losartan or Hydrochlorothiazide for right now. Dr. Gerlean Ren will decide if you need to restart these medications at your follow-up appointment.  Please make sure you follow-up with urology in 1 week. Their office should call you to schedule an appointment. If you haven't heard from them, you can call our office and ask Dr. Gerlean Ren for assistance.

## 2016-08-11 NOTE — Progress Notes (Signed)
Patient was instructed on how t empty left nephrostomy drain. Leg bag will not connect at this time, because there is no tubing available for that. Tubing for current drain does not come loose except at insertion site. Elastic straps were placed on drain to help function as a leg bag. Patient knows and demonstrated how to empty this.

## 2016-08-11 NOTE — Progress Notes (Signed)
Catheter removed at this time. Pt tolerated well.

## 2016-08-11 NOTE — Progress Notes (Signed)
Family Medicine Teaching Service Daily Progress Note Intern Pager: [phone]  Patient name: Eric Padilla Medical record number: [id number] Date of birth: 03-14-1956 Age: 61 y.o. Gender: male  Primary Care Provider: Junie Panning, DO Consultants: Urology  Code Status: Full code   Pt Overview and Major Events to Date:    Assessment and Plan:  Eric Padilla is a 61 y.o. male presenting with urinary retention. PMH is significant for HTN, HLD, and PAD.   Urinary Retention: Hydronephrosis and hydroureter bilaterally seen on CT abdomen. Enlarged prostate and recent normal PSA. S/p Left percutaneous nephrostomy tube 3/9 for obstructing bladder mass.  PCN urine sent for cx/cytology.  Per urology recs, in a couple weeks cystoscopy for possible TURBT.  Coude catheter removed this AM and patient tolerated well.  Per renal recs will do a trial of voiding today.   -UOP -770  -continue flomax  -Monitor UOP, renal function. Cr improving this AM 1.55 >>1.48.    UTI, resolved:  Has remained afebrile, no CVA tenderness or burning symptoms.  LA to 2.97 trended down to 1.55. Ucx no growth and blood cx NG2D. White count improved from 17>10.7>11.2.    Discomfort likely 2/2 irritation from cath.  -low threshold to broaden antibiotics if worsens   AKI, improving. Cr 2.38 at admission.  Worsening of Cr likely due to post-renal obstructive process.  Improved to 1.48.  -hold home Losartan  Weight Loss: HIV screen negative.  -patient will need outpatient colonoscopy given history of colonoscopy in 2015 with 2 tubular adenomas and 1 tubulovillous adenoma with recommended f/u in 1 year that patient was unable to complete  -Urology will perform cystoscopy in 1-2 weeks following discharge to evaluate   Hypokalemia, resolved .  K+ 3.9 this AM  -hold home HCTZ  -AM BMP check  HTN: BPs stable at admission.  BP this AM 136/76.  -continue Metoprolol  -holding home Valsartan and HCTZ   PAD/HLD: Right to left  femoral-femoral bypass graft, right lower extremity femoral-popliteal artery bypass graft with vein and right popliteal endarterectomy performed 01/07/2011. -continue ASA and statin   Alcohol Abuse: No longer requires CIWA monitoring.     FEN/GI: Regular diet  Prophylaxis: Lovenox  Disposition: Trial of voiding today then likely discharge home.  Will touch base with Urology this AM to make sure ok to go home.   Subjective:  Feels well this morning. Anxious to go home.  Catheter removed this AM.  Will do a voiding trial.   Objective: Temp:  [98 F (36.7 C)-98.3 F (36.8 C)] 98.3 F (36.8 C) (03/11 0551) Pulse Rate:  [84-100] 84 (03/11 0551) Resp:  [17-18] 18 (03/11 0551) BP: (130-138)/(74-76) 136/76 (03/11 0551) SpO2:  [96 %-98 %] 96 % (03/11 0551)   Physical Exam: General: 61yo M laying in bed, in NAD  Cardiovascular: RRR, no MRG Respiratory: CTA B/L  no increased work of breathing on RA Abdomen: soft, NTND, +bs Extremities: warm and well perfused  Psych: normal mood and affect   Laboratory:  Recent Labs Lab 08/09/16 0900 08/10/16 0535 08/11/16 0509  WBC 10.7* 11.2* 10.1  HGB 10.9* 10.7* 10.5*  HCT 32.0* 31.9* 31.2*  PLT 357 364 377    Recent Labs Lab 08/07/16 1809  08/09/16 0900 08/10/16 0535 08/11/16 0509  NA 133*  < > 135 134* 133*  K 2.9*  < > 4.4 4.7 3.9  CL 94*  < > 105 98* 100*  CO2 26  < > 24 27 26   BUN  33*  < > 18 15 20   CREATININE 2.38*  < > 1.63* 1.51* 1.48*  CALCIUM 9.8  < > 8.8* 9.0 8.9  PROT 6.9  --   --   --   --   BILITOT 0.8  --   --   --   --   ALKPHOS 143*  --   --   --   --   ALT 11*  --   --   --   --   AST 18  --   --   --   --   GLUCOSE 127*  < > 106* 106* 110*  < > = values in this interval not displayed. Imaging/Diagnostic Tests: Ir Nephrostomy Placement Left  Result Date: 08/09/2016 INDICATION: 61 year old male with new diagnosis of bladder mass, left-sided hydronephrosis and obstructed uropathy. His renal function is  slightly improved today but remains elevated consistent with acute kidney injury. EXAM: IR NEPHROSTOMY PLACEMENT LEFT COMPARISON:  CT scan of the abdomen and pelvis 08/08/2026 MEDICATIONS: 2 g Ancef; The antibiotic was administered in an appropriate time frame prior to skin puncture. ANESTHESIA/SEDATION: Fentanyl 125 mcg IV; Versed 2.5 mg IV Moderate Sedation Time:  20 minutes The patient was continuously monitored during the procedure by the interventional radiology nurse under my direct supervision. CONTRAST:  53mL ISOVUE-300 IOPAMIDOL (ISOVUE-300) INJECTION 61% - administered into the collecting system(s) FLUOROSCOPY TIME:  Fluoroscopy Time: 3 minutes 42 seconds (9 mGy). COMPLICATIONS: None immediate. TECHNIQUE: The procedure, risks, benefits, and alternatives were explained to the patient. Questions regarding the procedure were encouraged and answered. The patient understands and consents to the procedure. The left flank was prepped with chlorhexidine in a sterile fashion, and a sterile drape was applied covering the operative field. A sterile gown and sterile gloves were used for the procedure. Local anesthesia was provided with 1% Lidocaine. The left flank was interrogated with ultrasound and the left kidney identified. The kidney is hydronephrotic. A suitable access site on the skin overlying the lower pole, posterior calix was identified. After local mg anesthesia was achieved, a small skin nick was made with an 11 blade scalpel. A 21 gauge Accustick needle was then advanced under direct sonographic guidance into the lower pole of the left kidney. A 0.018 inch wire was advanced under fluoroscopic guidance into the left renal collecting system. The Accustick sheath was then advanced over the wire and a 0.018 system exchanged for a 0.035 system. Gentle hand injection of contrast material confirms placement of the sheath within the renal collecting system. There is hydronephrosis and distal ureteral  obstruction. The tract from the scan into the renal collecting system was then dilated serially to 10-French. A 10-French Cook all-purpose drain was then placed and positioned under fluoroscopic guidance. The locking loop is well formed within the left renal pelvis. The catheter was secured to the skin with 2-0 Prolene and a sterile bandage was placed. Catheter was left to gravity bag drainage. The right flank was then interrogated with ultrasound at the right kidney identified. There is no right-sided hydronephrosis. Placement of a right-sided percutaneous nephrostomy tube was therefore deferred. IMPRESSION: Successful placement of a left 10 French percutaneous nephrostomy tube. Ultrasound evaluation of the right kidney demonstrates no hydronephrosis. In light of the patient's slightly improved renal function today, right-sided nephrostomy tube placement was deferred. Signed, Criselda Peaches, MD [facility] Radiology Electronically Signed   By: Jacqulynn Cadet M.D.   On: 08/09/2016 17:31    Enis Slipper  Reesa Chew, MD 08/11/2016, 11:01 AM PGY-1, [place] Intern pager: [phone], text pages welcome

## 2016-08-11 NOTE — Discharge Summary (Signed)
[facility] Discharge Summary  Patient name: Eric Padilla Medical record number: [id number] Date of birth: 1955/08/20 Age: 61 y.o. Gender: male Date of Admission: 08/07/2016  Date of Discharge: 08/11/3016 Admitting Physician: Dickie La, MD  Primary Care Provider: Junie Panning, DO Consultants: Urology   Indication for Hospitalization: Urinary retention   Discharge Diagnoses/Problem List:  Urinary retention Acute kidney injury Hypokalemia Hypertension Peripheral arterial disease Hyperlipidemia  Disposition: Home  Discharge Condition:  Stable, improved   Discharge Exam: General: 61yo M laying in bed, in NAD  Cardiovascular: RRR, no MRG Respiratory: CTA B/L  no increased work of breathing on RA Abdomen: soft, NTND, +bs Extremities: warm and well perfused  Psych: normal mood and affect   Brief Hospital Course:   61 yo male with urinary retention and recent weight loss.  On CT abdomen marked hydronephrosis and hydroureter bilaterally. Patient was noted to have large prostate, with recent PSA normal.  Coude cath was placed in the ED due to inability to advance foley catheter beyond prostate. He was admitted to inpatient service and monitored for UOP.  Flomax was started.  In addition, patient endorsing symptoms consistent with UTI.  UA with large hgb, large leukocytes, negative nitrites, too numerous to count wbc's.  Patient was afebrile and had no CVA tenderness.    He was started on ceftriaxone.  Creatinine elevated to 2.38 on admission and noted to have been increasing over last few lab checks.  Worsening of Cr likely due to post-renal obstructive process.  IVFs were given to hydrate the patient.   Workup was done for the unintentional weight loss and given significant history of tobacco use and hematuria, concern for malignancy.  Abnormal soft tissue  in bladder lumen visualized on CT abdomen concerning for mass vs. clot.  Urology was consulted the  following morning and planned for bilateral nephrostomy tube placement.  With cystoscopy in a couple weeks for possible TURBT.  Urology decided to defer decompression of Right side kidney as no significant hydronephrosis on that side and only left-side nephrostomy tube was placed. Patient tolerated procedure well.   UOP and renal function monitored closely.  White count improved to 10.7, Creatinine improved to 1.55 and back at baseline. Voiding trial was done prior to discharge to ensure he is able to independently void.  This was successful and was discharged home in stable condition.    Issues for Follow Up:  1. We held Pt's Losartan and HCTZ while hospitalized because of his AKI. These were not restarted on discharge because pt was normotensive. Please follow-up blood pressure and Cr and restart these medications as appropriate. 2. Pt was started on Flomax per urology's recommendations. Please make sure he has picked up this prescription and is taking this medication. 3. Pt needs urology f/u in 1 week. Their office should schedule this. Please make sure this has been done. 4. Urology mentioned plan to perform cystoscopy in a couple of weeks.   5. Will need outpatient colonoscopy given history of colonoscopy in 2015 with 2 tubular adenomas and 1 tubulovillous adenoma with recommended f/u in 1 year that patient was unable to complete   Significant Procedures: L nephrostomy tube placement   Significant Labs and Imaging:   Recent Labs Lab 08/09/16 0900 08/10/16 0535 08/11/16 0509  WBC 10.7* 11.2* 10.1  HGB 10.9* 10.7* 10.5*  HCT 32.0* 31.9* 31.2*  PLT 357 364 377    Recent Labs Lab 08/08/16 0338 08/09/16 0900 08/10/16 0535 08/11/16 0509  NA 136 135 134* 133*  K 3.0* 4.4 4.7 3.9  CL 101 105 98* 100*  CO2 26 24 27 26   GLUCOSE 121* 106* 106* 110*  BUN 28* 18 15 20   CREATININE 1.97* 1.63* 1.51* 1.48*  CALCIUM 8.6* 8.8* 9.0 8.9  MG 1.5*  --   --   --    Results/Tests Pending at  Time of Discharge: None  Discharge Medications:  Allergies as of 08/11/2016   No Known Allergies     Medication List    STOP taking these medications   hydrochlorothiazide 50 MG tablet Commonly known as:  HYDRODIURIL   losartan 50 MG tablet Commonly known as:  COZAAR     TAKE these medications   aspirin EC 81 MG tablet Take 1 tablet (81 mg total) by mouth daily.   atorvastatin 40 MG tablet Commonly known as:  LIPITOR Take 1 tablet (40 mg total) by mouth daily.   metoprolol succinate 100 MG 24 hr tablet Commonly known as:  TOPROL-XL Take 1 tablet (100 mg total) by mouth daily. Take with or immediately following a meal.   tamsulosin 0.4 MG Caps capsule Commonly known as:  FLOMAX Take 1 capsule (0.4 mg total) by mouth daily.      Discharge Instructions: Please refer to Patient Instructions section of EMR for full details.  Patient was counseled important signs and symptoms that should prompt return to medical care, changes in medications, dietary instructions, activity restrictions, and follow up appointments.   Follow-Up Appointments: Follow-up [place], DO Follow up on 08/13/2016.   Specialty:  Family Medicine Why:  Hospital follow-up appointment at 4:00pm. Contact information: [address] [place] [address] [phone]          Lovenia Kim, MD 08/14/2016, 11:09 PM PGY-1, [place]

## 2016-08-12 ENCOUNTER — Other Ambulatory Visit: Payer: Self-pay | Admitting: Urology

## 2016-08-12 LAB — CULTURE, BLOOD (ROUTINE X 2)
CULTURE: NO GROWTH
Culture: NO GROWTH

## 2016-08-13 ENCOUNTER — Inpatient Hospital Stay: Payer: Medicare Other | Admitting: Family Medicine

## 2016-08-15 NOTE — Patient Instructions (Signed)
Eric Padilla  08/15/2016   Your procedure is scheduled on: 08/27/2016   Report to [facility] Main  Entrance take [place]  elevators to 3rd floor to  [place] at   [place] AM.  Call this number if you have problems the morning of surgery [phone]   Remember: ONLY 1 PERSON MAY GO WITH YOU TO SHORT STAY TO GET  READY MORNING OF [name].  Do not eat food or drink liquids :After Midnight.     Take these medicines the morning of surgery with A SIP OF WATER: Toprol , Flomax                                 You may not have any metal on your body including hair pins and              piercings  Do not wear jewelry,  lotions, powders or perfumes, deodorant                        Men may shave face and neck.   Do not bring valuables to the hospital. [place].  Contacts, dentures or bridgework may not be worn into surgery.      Patients discharged the day of surgery will not be allowed to drive home.  Name and phone number of your driver:                Please read over the following fact sheets you were given: _____________________________________________________________________             [facility] - Preparing for Surgery Before surgery, you can play an important role.  Because skin is not sterile, your skin needs to be as free of germs as possible.  You can reduce the number of germs on your skin by washing with CHG (chlorahexidine gluconate) soap before surgery.  CHG is an antiseptic cleaner which kills germs and bonds with the skin to continue killing germs even after washing. Please DO NOT use if you have an allergy to CHG or antibacterial soaps.  If your skin becomes reddened/irritated stop using the CHG and inform your nurse when you arrive at Short Stay. Do not shave (including legs and underarms) for at least 48 hours prior to the first CHG shower.  You may shave your face/neck. Please follow  these instructions carefully:  1.  Shower with CHG Soap the night before surgery and the  morning of Surgery.  2.  If you choose to wash your hair, wash your hair first as usual with your  normal  shampoo.  3.  After you shampoo, rinse your hair and body thoroughly to remove the  shampoo.                           4.  Use CHG as you would any other liquid soap.  You can apply chg directly  to the skin and wash                       Gently with a scrungie or clean washcloth.  5.  Apply the CHG Soap to your  body ONLY FROM THE NECK DOWN.   Do not use on face/ open                           Wound or open sores. Avoid contact with eyes, ears mouth and genitals (private parts).                       Wash face,  Genitals (private parts) with your normal soap.             6.  Wash thoroughly, paying special attention to the area where your surgery  will be performed.  7.  Thoroughly rinse your body with warm water from the neck down.  8.  DO NOT shower/wash with your normal soap after using and rinsing off  the CHG Soap.                9.  Pat yourself dry with a clean towel.            10.  Wear clean pajamas.            11.  Place clean sheets on your bed the night of your first shower and do not  sleep with pets. Day of Surgery : Do not apply any lotions/deodorants the morning of surgery.  Please wear clean clothes to the hospital/surgery center.  FAILURE TO FOLLOW THESE INSTRUCTIONS MAY RESULT IN THE CANCELLATION OF YOUR SURGERY PATIENT SIGNATURE_________________________________  NURSE SIGNATURE__________________________________  ________________________________________________________________________

## 2016-08-19 ENCOUNTER — Encounter (HOSPITAL_COMMUNITY)
Admission: RE | Admit: 2016-08-19 | Discharge: 2016-08-19 | Disposition: A | Discharge status: Home / Self Care | Payer: Medicare Other | Source: Ambulatory Visit | Attending: Urology | Admitting: Urology

## 2016-08-19 ENCOUNTER — Inpatient Hospital Stay: Payer: Medicare Other | Admitting: Family Medicine

## 2016-08-19 ENCOUNTER — Encounter (HOSPITAL_COMMUNITY): Payer: Self-pay

## 2016-08-19 DIAGNOSIS — Z01812 Encounter for preprocedural laboratory examination: Secondary | ICD-10-CM | POA: Insufficient documentation

## 2016-08-19 DIAGNOSIS — N1330 Unspecified hydronephrosis: Secondary | ICD-10-CM | POA: Insufficient documentation

## 2016-08-19 DIAGNOSIS — Z01818 Encounter for other preprocedural examination: Secondary | ICD-10-CM | POA: Diagnosis present

## 2016-08-19 DIAGNOSIS — D414 Neoplasm of uncertain behavior of bladder: Secondary | ICD-10-CM | POA: Diagnosis not present

## 2016-08-19 LAB — CBC
HEMATOCRIT: 33 % — AB (ref 39.0–52.0)
HEMOGLOBIN: 11.3 g/dL — AB (ref 13.0–17.0)
MCH: 31.8 pg (ref 26.0–34.0)
MCHC: 34.2 g/dL (ref 30.0–36.0)
MCV: 93 fL (ref 78.0–100.0)
Platelets: 606 10*3/uL — ABNORMAL HIGH (ref 150–400)
RBC: 3.55 MIL/uL — AB (ref 4.22–5.81)
RDW: 14.8 % (ref 11.5–15.5)
WBC: 15.3 10*3/uL — ABNORMAL HIGH (ref 4.0–10.5)

## 2016-08-19 LAB — BASIC METABOLIC PANEL
Anion gap: 11 (ref 5–15)
BUN: 15 mg/dL (ref 6–20)
CHLORIDE: 104 mmol/L (ref 101–111)
CO2: 23 mmol/L (ref 22–32)
Calcium: 9.7 mg/dL (ref 8.9–10.3)
Creatinine, Ser: 1.36 mg/dL — ABNORMAL HIGH (ref 0.61–1.24)
GFR calc Af Amer: >60 mL/min (ref >60)
GFR calc non Af Amer: 54 mL/min — ABNORMAL LOW (ref >60)
GLUCOSE: 103 mg/dL — AB (ref 65–99)
POTASSIUM: 4.8 mmol/L (ref 3.5–5.1)
Sodium: 138 mmol/L (ref 135–145)

## 2016-08-19 NOTE — Progress Notes (Signed)
08/08/16 ekg epic CXR 08/07/16 epic 08/11/16 cbc epic hgb 10.5 repeated on preop BMP 08/11/16  Epic creat. Elevated repeated at preop.

## 2016-08-19 NOTE — Progress Notes (Signed)
Cbc amd bmp done 08/19/16 routed via epic to Dr. Junious Silk

## 2016-08-25 NOTE — H&P (Signed)
Office Visit Report     08/23/2016   --------------------------------------------------------------------------------   Eric Padilla  MRN: [id number]  PRIMARY CARE:    DOB: 1955/07/02, 61 year old Male  REFERRING:    SSN: [id number]  PROVIDER:  Festus Aloe, M.D.    LOCATION:  [facility] - [phone]   --------------------------------------------------------------------------------   CC/HPI: Bladder mass-the patient was seen as Hospital consult August 07, 2016. He had frequency urgency and weak stream and CT scan revealed a large bladder mass which made the solid or hematoma with left greater than right hydronephrosis. A Foley was placed but he did pass a voiding trial. He was initially acute kidney injury with kidney function recovered. He underwent left nephrostomy placement, the right hydronephrosis resolved. He is on tamsulosin. He did endorse significant weight loss of about 30 pounds over the last few months and gross hematuria with clots. He is a smoker. He's a retired Clinical biochemist. He continues to have occasional urgency and weak stream and other times avoid with a good flow. He did have some constipation and after a bowel movement this morning voided with a normal stream.   He had a PSA checked July 18, 2016 which was 1.6.     CC: I have hydronephrosis.  HPI: [name] is a 61 year-old male established patient who is here for hydronephrosis.  His problem was diagnosed 08/07/2016. The problem is on both sides. He had the following x-rays done: Renal Ultrasound and CT Scan.   He is not currently having flank pain, back pain, groin pain, nausea, vomiting, fever or chills.   He is status post a left nephrostomy tube August 09, 2016.     ALLERGIES: None   MEDICATIONS: Lipitor  Metoprolol Tartrate  Flomax 0.4 mg capsule, ext release 24 hr     GU PSH: No GU PSH    NON-GU PSH: No Non-GU PSH    GU PMH: None   NON-GU PMH: Hypertension    FAMILY HISTORY: None    SOCIAL HISTORY: Marital Status: Divorced Drinks 2 drinks per day.  Drinks 2 caffeinated drinks per day.     Notes: 1 son    REVIEW OF SYSTEMS:    GU Review Male:   Patient reports frequent urination, hard to postpone urination, burning/ pain with urination, get up at night to urinate, leakage of urine, stream starts and stops, trouble starting your stream, have to strain to urinate , and penile pain. Patient denies erection problems.  Gastrointestinal (Upper):   Patient denies nausea, vomiting, and indigestion/ heartburn.  Gastrointestinal (Lower):   Patient reports diarrhea and constipation.   Constitutional:   Patient denies fever, night sweats, weight loss, and fatigue.  Skin:   Patient denies skin rash/ lesion and itching.  Eyes:   Patient denies blurred vision and double vision.  Ears/ Nose/ Throat:   Patient denies sore throat and sinus problems.  Hematologic/Lymphatic:   Patient denies swollen glands and easy bruising.  Cardiovascular:   Patient denies leg swelling and chest pains.  Respiratory:   Patient denies cough and shortness of breath.  Endocrine:   Patient denies excessive thirst.  Musculoskeletal:   Patient denies back pain and joint pain.  Neurological:   Patient denies headaches and dizziness.  Psychologic:   Patient denies depression and anxiety.   VITAL SIGNS:      08/23/2016 03:24 PM  Weight 134 lb / 60.78 kg  Height 71 in / 180.34 cm  BP 136/80 mmHg  Pulse 112 /min  Temperature 97.4 F / 36 C  BMI 18.7 kg/m   GU PHYSICAL EXAMINATION:    Anus and Perineum: No hemorrhoids. No anal stenosis. No rectal fissure, no anal fissure. No edema, no dimple, no perineal tenderness, no anal tenderness.  Scrotum: No lesions. No edema. No cysts. No warts.  Epididymides: Right: no spermatocele, no masses, no cysts, no tenderness, no induration, no enlargement. Left: no spermatocele, no masses, no cysts, no tenderness, no induration, no enlargement.  Testes: No tenderness,  no swelling, no enlargement left testes. No tenderness, no swelling, no enlargement right testes. Normal location left testes. Normal location right testes. No mass, no cyst, no varicocele, no hydrocele left testes. No mass, no cyst, no varicocele, no hydrocele right testes.  Urethral Meatus: Normal size. No lesion, no wart, no discharge, no polyp. Normal location.  Penis: Circumcised, no warts, no cracks. No dorsal Peyronie's plaques, no left corporal Peyronie's plaques, no right corporal Peyronie's plaques, no scarring, no warts. No balanitis, no meatal stenosis.  Prostate: Prostate about 50 grams. Left lobe firm. Right lobe normal consistency. Symmetrical lobes. No prostate nodule. Left lobe no tenderness, right lobe no tenderness.   Seminal Vesicles: Nonpalpable.  Sphincter Tone: Normal sphincter. No rectal tenderness. No rectal mass.    MULTI-SYSTEM PHYSICAL EXAMINATION:    Constitutional: Well-nourished. No physical deformities. Normally developed. Good grooming.  Neck: Neck symmetrical, not swollen. Normal tracheal position.  Respiratory: No labored breathing, no use of accessory muscles.   Cardiovascular: Normal temperature, normal extremity pulses, no swelling, no varicosities.  Skin: No paleness, no jaundice, no cyanosis. No lesion, no ulcer, no rash.  Neurologic / Psychiatric: Oriented to time, oriented to place, oriented to person. No depression, no anxiety, no agitation.     PAST DATA REVIEWED:  Source Of History:  Patient  Lab Test Review:   BUN/Creatinine  Records Review:   Previous Doctor Records  X-Ray Review: C.T. Abdomen/Pelvis: Reviewed Films.     PROCEDURES:         Flexible Cystoscopy - 52000  Risks, benefits, and some of the potential complications of the procedure were discussed with the patient. All questions were answered. Informed consent was obtained. Antibiotic prophylaxis was given -- Cipro. Sterile technique and intraurethral analgesia were used.  Meatus:   Normal size. Normal location. Normal condition.  Urethra:  No strictures.  External Sphincter:  Normal.  Verumontanum:  Normal.  Prostate:  Non-obstructing. No hyperplasia.  Bladder Neck:  Non-obstructing.  Ureteral Orifices:  Normal location. Normal size. Normal shape. Effluxed clear urine.  Bladder:  3+ cm tumor - may be clot -- at the bladder neck and fills lumen. I couldn't negotiate into the bladder mainly due to pt discomfort.       The lower urinary tract was carefully examined. The procedure was well-tolerated and without complications. Antibiotic instructions were given. Instructions were given to call the office immediately for bloody urine, difficulty urinating, painful urination, fever, chills, nausea, vomiting or other illness. The patient stated that he understood these instructions and would comply with them.         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 Abdomen/Pelvis With I.V. Contrast - pt BUN 15, cr 1.36, 3/19 -- gfr 54 - may need reduced dose. Needs to be done Monday 3/26, has OR Tuesday. Thanks.   Return Visit/Planned Activity: Next Available Appointment - Office Visit, Schedule Surgery          Document Letter(s):  Created for Patient: Clinical Summary          Notes:   bladder mass - on cystoscopy and a lot of that looks like it could be a clot. I'm going to scan him with IV contrast. I also will check the ureters. Also for staging.   hydronephrosis - status post left nephrostomy tube.   cc; [facility]     * Signed by Festus Aloe, M.D. on 08/23/16 at 5:42 PM (EDT)*     The information contained in this medical record document is considered private and confidential patient information. This information can only be used for the medical diagnosis and/or medical services that are being provided by the patient's selected caregivers. This information can only be distributed outside of the patient's care if the patient agrees and signs waivers of authorization for this information to be sent to an outside source or route.

## 2016-08-26 ENCOUNTER — Other Ambulatory Visit: Payer: Self-pay | Admitting: Family Medicine

## 2016-08-27 ENCOUNTER — Ambulatory Visit (HOSPITAL_COMMUNITY): Payer: Medicare Other | Admitting: Certified Registered Nurse Anesthetist

## 2016-08-27 ENCOUNTER — Encounter (HOSPITAL_COMMUNITY)
Admission: RE | Disposition: A | Discharge status: Home / Self Care | Payer: Self-pay | Source: Ambulatory Visit | Attending: Urology

## 2016-08-27 ENCOUNTER — Ambulatory Visit (HOSPITAL_COMMUNITY)
Admission: RE | Admit: 2016-08-27 | Discharge: 2016-08-27 | Disposition: A | Discharge status: Home / Self Care | Payer: Medicare Other | Source: Ambulatory Visit | Attending: Urology | Admitting: Urology

## 2016-08-27 ENCOUNTER — Encounter (HOSPITAL_COMMUNITY): Payer: Self-pay

## 2016-08-27 DIAGNOSIS — Z7982 Long term (current) use of aspirin: Secondary | ICD-10-CM | POA: Diagnosis not present

## 2016-08-27 DIAGNOSIS — Z87891 Personal history of nicotine dependence: Secondary | ICD-10-CM | POA: Diagnosis not present

## 2016-08-27 DIAGNOSIS — I10 Essential (primary) hypertension: Secondary | ICD-10-CM | POA: Diagnosis not present

## 2016-08-27 DIAGNOSIS — J449 Chronic obstructive pulmonary disease, unspecified: Secondary | ICD-10-CM | POA: Insufficient documentation

## 2016-08-27 DIAGNOSIS — Z79899 Other long term (current) drug therapy: Secondary | ICD-10-CM | POA: Insufficient documentation

## 2016-08-27 DIAGNOSIS — C679 Malignant neoplasm of bladder, unspecified: Secondary | ICD-10-CM | POA: Insufficient documentation

## 2016-08-27 DIAGNOSIS — N1330 Unspecified hydronephrosis: Secondary | ICD-10-CM | POA: Insufficient documentation

## 2016-08-27 DIAGNOSIS — Z936 Other artificial openings of urinary tract status: Secondary | ICD-10-CM | POA: Insufficient documentation

## 2016-08-27 DIAGNOSIS — C801 Malignant (primary) neoplasm, unspecified: Secondary | ICD-10-CM

## 2016-08-27 DIAGNOSIS — N329 Bladder disorder, unspecified: Secondary | ICD-10-CM | POA: Insufficient documentation

## 2016-08-27 HISTORY — DX: Malignant (primary) neoplasm, unspecified: C80.1

## 2016-08-27 HISTORY — PX: TRANSURETHRAL RESECTION OF BLADDER TUMOR: SHX2575

## 2016-08-27 HISTORY — PX: BLADDER SURGERY: SHX569

## 2016-08-27 LAB — COMPREHENSIVE METABOLIC PANEL
ALBUMIN: 2.8 g/dL — AB (ref 3.5–5.0)
ALT: 8 U/L — ABNORMAL LOW (ref 17–63)
ANION GAP: 6 (ref 5–15)
AST: 13 U/L — ABNORMAL LOW (ref 15–41)
Alkaline Phosphatase: 98 U/L (ref 38–126)
BILIRUBIN TOTAL: 0.6 mg/dL (ref 0.3–1.2)
BUN: 12 mg/dL (ref 6–20)
CHLORIDE: 102 mmol/L (ref 101–111)
CO2: 27 mmol/L (ref 22–32)
Calcium: 8.8 mg/dL — ABNORMAL LOW (ref 8.9–10.3)
Creatinine, Ser: 1.3 mg/dL — ABNORMAL HIGH (ref 0.61–1.24)
GFR, EST NON AFRICAN AMERICAN: 57 mL/min — AB (ref >60)
Glucose, Bld: 114 mg/dL — ABNORMAL HIGH (ref 65–99)
POTASSIUM: 4 mmol/L (ref 3.5–5.1)
SODIUM: 135 mmol/L (ref 135–145)
TOTAL PROTEIN: 6.1 g/dL — AB (ref 6.5–8.1)

## 2016-08-27 SURGERY — TURBT (TRANSURETHRAL RESECTION OF BLADDER TUMOR)
Anesthesia: General

## 2016-08-27 MED ORDER — ONDANSETRON HCL 4 MG/2ML IJ SOLN
INTRAMUSCULAR | Status: AC
  Filled 2016-08-27: qty 2

## 2016-08-27 MED ORDER — FENTANYL CITRATE (PF) 100 MCG/2ML IJ SOLN
INTRAMUSCULAR | Status: AC
  Filled 2016-08-27: qty 2

## 2016-08-27 MED ORDER — HYDROMORPHONE HCL 1 MG/ML IJ SOLN
INTRAMUSCULAR | Status: AC
  Filled 2016-08-27: qty 1

## 2016-08-27 MED ORDER — MIDAZOLAM HCL 5 MG/5ML IJ SOLN
INTRAMUSCULAR | Status: DC | PRN
  Administered 2016-08-27 (×2): 1 mg via INTRAVENOUS

## 2016-08-27 MED ORDER — ROCURONIUM BROMIDE 100 MG/10ML IV SOLN
INTRAVENOUS | Status: DC | PRN
  Administered 2016-08-27: 10 mg via INTRAVENOUS
  Administered 2016-08-27: 25 mg via INTRAVENOUS
  Administered 2016-08-27: 10 mg via INTRAVENOUS

## 2016-08-27 MED ORDER — SUGAMMADEX SODIUM 200 MG/2ML IV SOLN
INTRAVENOUS | Status: DC | PRN
  Administered 2016-08-27: 200 mg via INTRAVENOUS

## 2016-08-27 MED ORDER — SUGAMMADEX SODIUM 200 MG/2ML IV SOLN
INTRAVENOUS | Status: AC
  Filled 2016-08-27: qty 2

## 2016-08-27 MED ORDER — PHENYLEPHRINE HCL 10 MG/ML IJ SOLN
INTRAMUSCULAR | Status: AC
  Filled 2016-08-27: qty 1

## 2016-08-27 MED ORDER — LACTATED RINGERS IV SOLN
INTRAVENOUS | Status: DC | PRN
  Administered 2016-08-27 (×2): via INTRAVENOUS

## 2016-08-27 MED ORDER — HYDROMORPHONE HCL 1 MG/ML IJ SOLN
0.2500 mg | INTRAMUSCULAR | Status: DC | PRN
  Administered 2016-08-27 (×3): 0.5 mg via INTRAVENOUS

## 2016-08-27 MED ORDER — HYDROMORPHONE HCL 1 MG/ML IJ SOLN
INTRAMUSCULAR | Status: AC
  Filled 2016-08-27: qty 0.5

## 2016-08-27 MED ORDER — PROPOFOL 10 MG/ML IV BOLUS
INTRAVENOUS | Status: DC | PRN
  Administered 2016-08-27: 150 mg via INTRAVENOUS

## 2016-08-27 MED ORDER — SODIUM CHLORIDE 0.9 % IR SOLN
Status: DC | PRN
  Administered 2016-08-27: 39000 mL

## 2016-08-27 MED ORDER — PHENYLEPHRINE HCL 10 MG/ML IJ SOLN
INTRAMUSCULAR | Status: DC | PRN
  Administered 2016-08-27 (×2): 80 ug via INTRAVENOUS

## 2016-08-27 MED ORDER — SODIUM CHLORIDE 0.9 % IV SOLN
INTRAVENOUS | Status: DC | PRN
  Administered 2016-08-27: 50 ug/min via INTRAVENOUS

## 2016-08-27 MED ORDER — LEVOFLOXACIN IN D5W 500 MG/100ML IV SOLN
500.0000 mg | INTRAVENOUS | Status: AC
  Administered 2016-08-27: 500 mg via INTRAVENOUS
  Filled 2016-08-27: qty 100

## 2016-08-27 MED ORDER — LIDOCAINE 2% (20 MG/ML) 5 ML SYRINGE
INTRAMUSCULAR | Status: AC
  Filled 2016-08-27: qty 5

## 2016-08-27 MED ORDER — ONDANSETRON HCL 4 MG/2ML IJ SOLN
INTRAMUSCULAR | Status: DC | PRN
  Administered 2016-08-27: 4 mg via INTRAVENOUS

## 2016-08-27 MED ORDER — LACTATED RINGERS IV SOLN: INTRAVENOUS | Status: DC

## 2016-08-27 MED ORDER — FENTANYL CITRATE (PF) 100 MCG/2ML IJ SOLN
INTRAMUSCULAR | Status: DC | PRN
  Administered 2016-08-27 (×3): 50 ug via INTRAVENOUS
  Administered 2016-08-27 (×2): 25 ug via INTRAVENOUS

## 2016-08-27 MED ORDER — OXYCODONE-ACETAMINOPHEN 5-325 MG PO TABS: 1.0000 | ORAL_TABLET | Freq: Four times a day (QID) | ORAL | 0 refills | Status: DC | PRN

## 2016-08-27 MED ORDER — MIDAZOLAM HCL 2 MG/2ML IJ SOLN
INTRAMUSCULAR | Status: AC
  Filled 2016-08-27: qty 2

## 2016-08-27 MED ORDER — LIDOCAINE 2% (20 MG/ML) 5 ML SYRINGE
INTRAMUSCULAR | Status: DC | PRN
  Administered 2016-08-27: 100 mg via INTRAVENOUS

## 2016-08-27 MED ORDER — PROPOFOL 10 MG/ML IV BOLUS
INTRAVENOUS | Status: AC
  Filled 2016-08-27: qty 20

## 2016-08-27 MED ORDER — MEPERIDINE HCL 50 MG/ML IJ SOLN: 6.2500 mg | INTRAMUSCULAR | Status: DC | PRN

## 2016-08-27 MED ORDER — OXYCODONE-ACETAMINOPHEN 5-325 MG PO TABS
2.0000 | ORAL_TABLET | Freq: Four times a day (QID) | ORAL | Status: AC | PRN
  Administered 2016-08-27: 2 via ORAL
  Filled 2016-08-27: qty 2

## 2016-08-27 MED ORDER — ONDANSETRON HCL 4 MG/2ML IJ SOLN: 4.0000 mg | Freq: Once | INTRAMUSCULAR | Status: DC | PRN

## 2016-08-27 SURGICAL SUPPLY — 16 items
BAG URINE DRAINAGE (UROLOGICAL SUPPLIES) ×4 IMPLANT
BAG URO CATCHER STRL LF (MISCELLANEOUS) ×4 IMPLANT
BASKET ZERO TIP NITINOL 2.4FR (BASKET) IMPLANT
CATH FOLEY 3WAY 30CC 20FR (CATHETERS) ×4 IMPLANT
CATH INTERMIT  6FR 70CM (CATHETERS) IMPLANT
CLOTH BEACON ORANGE TIMEOUT ST (SAFETY) ×4 IMPLANT
GLOVE BIOGEL M STRL SZ7.5 (GLOVE) ×4 IMPLANT
GOWN STRL REUS W/TWL LRG LVL3 (GOWN DISPOSABLE) ×4 IMPLANT
GOWN STRL REUS W/TWL XL LVL3 (GOWN DISPOSABLE) ×4 IMPLANT
GUIDEWIRE ANG ZIPWIRE 038X150 (WIRE) IMPLANT
GUIDEWIRE STR DUAL SENSOR (WIRE) ×4 IMPLANT
LOOP CUT BIPOLAR 24F LRG (ELECTROSURGICAL) ×8 IMPLANT
MANIFOLD NEPTUNE II (INSTRUMENTS) ×4 IMPLANT
PACK CYSTO (CUSTOM PROCEDURE TRAY) ×4 IMPLANT
TUBING CONNECTING 10 (TUBING) ×3 IMPLANT
TUBING CONNECTING 10' (TUBING) ×1

## 2016-08-27 NOTE — Transfer of Care (Signed)
Immediate Anesthesia Transfer of Care Note  Patient: Eric Padilla  Procedure(s) Performed: Procedure(s): TRANSURETHRAL RESECTION OF BLADDER TUMOR (TURBT) Greater than 5 (N/A)  Patient Location: PACU  Anesthesia Type:General  Level of Consciousness:  sedated, patient cooperative and responds to stimulation  Airway & Oxygen Therapy:Patient Spontanous Breathing and Patient connected to face mask oxgen  Post-op Assessment:  Report given to PACU RN and Post -op Vital signs reviewed and stable  Post vital signs:  Reviewed and stable  Last Vitals:  Vitals:   08/27/16 0530  BP: 111/65  Pulse: (!) 112  Resp: 20  Temp: 44.9 C    Complications: No apparent anesthesia complications

## 2016-08-27 NOTE — Anesthesia Procedure Notes (Signed)
Procedure Name: LMA Insertion Date/Time: 08/27/2016 7:37 AM Performed by: Maxwell Caul Pre-anesthesia Checklist: Patient identified, Emergency Drugs available, Suction available and Patient being monitored Patient Re-evaluated:Patient Re-evaluated prior to inductionOxygen Delivery Method: Circle system utilized Preoxygenation: Pre-oxygenation with 100% oxygen Intubation Type: IV induction LMA: LMA inserted LMA Size: 5.0 Number of attempts: 1 Placement Confirmation: breath sounds checked- equal and bilateral and positive ETCO2 Tube secured with: Tape Dental Injury: Teeth and Oropharynx as per pre-operative assessment

## 2016-08-27 NOTE — Anesthesia Preprocedure Evaluation (Signed)
Anesthesia Evaluation  Patient identified by MRN, date of birth, ID band Patient awake    Reviewed: Allergy & Precautions, NPO status , Patient's Chart, lab work & pertinent test results  Airway Mallampati: I  TM Distance: >3 FB Neck ROM: Full    Dental   Pulmonary COPD, former smoker,    Pulmonary exam normal        Cardiovascular hypertension, Pt. on medications Normal cardiovascular exam     Neuro/Psych    GI/Hepatic   Endo/Other    Renal/GU      Musculoskeletal   Abdominal   Peds  Hematology   Anesthesia Other Findings   Reproductive/Obstetrics                             Anesthesia Physical Anesthesia Plan  ASA: II  Anesthesia Plan: General   Post-op Pain Management:    Induction: Intravenous  Airway Management Planned: LMA  Additional Equipment:   Intra-op Plan:   Post-operative Plan: Extubation in OR  Informed Consent: I have reviewed the patients History and Physical, chart, labs and discussed the procedure including the risks, benefits and alternatives for the proposed anesthesia with the patient or authorized representative who has indicated his/her understanding and acceptance.     Plan Discussed with: CRNA and Surgeon  Anesthesia Plan Comments:         Anesthesia Quick Evaluation

## 2016-08-27 NOTE — Op Note (Signed)
Preoperative diagnosis: Left hydronephrosis, bladder mass/neoplasm Postoperative diagnosis: Same  Procedure: Cystoscopy, TURBT greater than 5 cm  Surgeon: Junious Silk  Anesthesia: Gen.  Indication for procedure: [name] is a 61 year old white male who presented with signs of urinary retention and left greater than right hide her a few weeks ago. On CT imaging there was an amorphous mass filling the entire bladder lumen but no obvious lymphadenopathy or bony metastases. He was brought today for TURBT and possible retrogrades, stent placement.  Findings: On exam under anesthesia the penis was circumcised and without mass or lesion. The testicles were descended bilaterally. On digital rectal exam the prostate was approximately 40 g and smooth without hard area or nodule. On bimanual exam there was a subtle mass in the bladder but no hard areas or fixed tissue.   On cystoscopic exam the urethra was normal, the prostatic urethra appeared normal and patent, there was a large mass filling the bladder lumen that appeared to be originating from the left trigone and left bladder wall and extended all the way up the left sidewall. It had grown to the right sidewall filling the entire bladder with a good half to two thirds of it becoming necrotic. The right ureteral orifice was identified and noted to be widely patent and refluxing. The left ureteral orifice was never identified.  Description of procedure: After consent was obtained patient brought to the operating room. After adequate anesthesia his placed in lithotomy position and prepped and draped in the usual sterile fashion. A timeout was performed to confirm the patient and procedure. The cystoscope was passed per urethra and immediately ran into an amorphous mass, tan appearing this would break apart with pressure from the stone but there was no bladder lumen remaining with the mass all the way over to the right bladder wall. There was some tumor obvious at  the left trigone and left bladder neck. I then took the loop and began to resect the tissue from anterior to posterior superior to inferior. After 2 hours I was able to resect about half of the necrotic mass. Amazingly, there was no bleeding until I got to the edges with good start to see a transition to viable tumor. Was able to identify the right ureteral orifice. Much of the right bladder had more cystitis changes. The bladder neck was patent. This created some good bladder volume. All these chips were irrigated and sent to pathology. Having gone to the edge and I could see more viable tumor that began to bleed I resected at the left bladder neck and was able to head down toward the left trigone and I sent this as tumor edge. I was not able to find the left ureteral orifice. Again the tumor spread the entire width of the bladder and at least 3/4th the length of the bladder up toward the dome. This is where the bleeding started and could be significant. Before getting into more of the viable tumor I thought was best to stop and evaluate the pathology. He had already been in lithotomy for about 2 hours. Depending on the pathology options might be another palliative TURBT versus cystectomy. We'll continue the left nephrostomy tube hemostasis was excellent as a fulgurated some of the tumor edge. All the chips were evacuated. I placed a 20 [place] three-way catheter and left some slow CBI for wake up. He was awakened and taken to the recovery room in stable condition.  Complications: None  Blood loss: 15 mL  Specimens to pathology: #  1 necrotic bladder tumor, #2 bladder tumor edge  Drains: 39 French three-way catheter  Disposition: Patient stable to PACU

## 2016-08-27 NOTE — Interval H&P Note (Signed)
History and Physical Interval Note:  08/27/2016 7:29 AM  Eric Padilla  has presented today for surgery, with the diagnosis of BILATERAL HYDRONEPHROSIS BLADDER MASS  The various methods of treatment have been discussed with the patient and family. After consideration of risks, benefits and other options for treatment, the patient has consented to  Procedure(s): TRANSURETHRAL RESECTION OF BLADDER TUMOR (TURBT) (N/A) CYSTOSCOPY WITH RETROGRADE PYELOGRAM/URETERAL STENT PLACEMENT (Bilateral) as a surgical intervention. He's been well. No dysuria. Discussed stents and foley post-op possible and we'll try and remove the Nx tube. He looks and feels well. The patient's history has been reviewed, patient examined, no change in status, stable for surgery.  I have reviewed the patient's chart and labs.  Questions were answered to the patient's satisfaction.     Kaj Vasil

## 2016-08-27 NOTE — Discharge Instructions (Signed)
Cystoscopy, Care After Refer to this sheet in the next few weeks. These instructions provide you with information about caring for yourself after your procedure. Your health care provider may also give you more specific instructions. Your treatment has been planned according to current medical practices, but problems sometimes occur. Call your health care provider if you have any problems or questions after your procedure. What can I expect after the procedure? After the procedure, it is common to have:  Mild pain when you urinate. Pain should stop within a few minutes after you urinate. This may last for up to 1 week.  A small amount of blood in your urine for several days.  Feeling like you need to urinate but producing only a small amount of urine. Follow these instructions at home:   Medicines   Take over-the-counter and prescription medicines only as told by your health care provider.  If you were prescribed an antibiotic medicine, take it as told by your health care provider. Do not stop taking the antibiotic even if you start to feel better. General instructions    Return to your normal activities as told by your health care provider. Ask your health care provider what activities are safe for you.  Do not drive for 24 hours if you received a sedative.  Watch for any blood in your urine. If the amount of blood in your urine increases, call your health care provider.  Follow instructions from your health care provider about eating or drinking restrictions.  If a tissue sample was removed for testing (biopsy) during your procedure, it is your responsibility to get your test results. Ask your health care provider or the department performing the test when your results will be ready.  Drink enough fluid to keep your urine clear or pale yellow.  Keep all follow-up visits as told by your health care provider. This is important. Contact a health care provider if:  You have pain that  gets worse or does not get better with medicine, especially pain when you urinate.  You have difficulty urinating. Get help right away if:  You have more blood in your urine.  You have blood clots in your urine.  You have abdominal pain.  You have a fever or chills.  You are unable to urinate. This information is not intended to replace advice given to you by your health care provider. Make sure you discuss any questions you have with your health care provider. Document Released: 12/07/2004 Document Revised: 10/26/2015 Document Reviewed: 04/06/2015 Elsevier Interactive Patient Education  2017 [employer] Anesthesia, Adult, Care After These instructions provide you with information about caring for yourself after your procedure. Your health care provider may also give you more specific instructions. Your treatment has been planned according to current medical practices, but problems sometimes occur. Call your health care provider if you have any problems or questions after your procedure. What can I expect after the procedure? After the procedure, it is common to have:  Vomiting.  A sore throat.  Mental slowness. It is common to feel:  Nauseous.  Cold or shivery.  Sleepy.  Tired.  Sore or achy, even in parts of your body where you did not have surgery. Follow these instructions at home: For at least 24 hours after the procedure:   Do not:  Participate in activities where you could fall or become injured.  Drive.  Use heavy machinery.  Drink alcohol.  Take sleeping pills or medicines that  cause drowsiness.  Make important decisions or sign legal documents.  Take care of children on your own.  Rest. Eating and drinking   If you vomit, drink water, juice, or soup when you can drink without vomiting.  Drink enough fluid to keep your urine clear or pale yellow.  Make sure you have little or no nausea before eating solid foods.  Follow the  diet recommended by your health care provider. General instructions   Have a responsible adult stay with you until you are awake and alert.  Return to your normal activities as told by your health care provider. Ask your health care provider what activities are safe for you.  Take over-the-counter and prescription medicines only as told by your health care provider.  If you smoke, do not smoke without supervision.  Keep all follow-up visits as told by your health care provider. This is important. Contact a health care provider if:  You continue to have nausea or vomiting at home, and medicines are not helpful.  You cannot drink fluids or start eating again.  You cannot urinate after 8-12 hours.  You develop a skin rash.  You have fever.  You have increasing redness at the site of your procedure. Get help right away if:  You have difficulty breathing.  You have chest pain.  You have unexpected bleeding.  You feel that you are having a life-threatening or urgent problem. This information is not intended to replace advice given to you by your health care provider. Make sure you discuss any questions you have with your health care provider. Document Released: 08/26/2000 Document Revised: 10/23/2015 Document Reviewed: 05/04/2015 Elsevier Interactive Patient Education  2017 [employer].

## 2016-08-27 NOTE — Brief Op Note (Signed)
08/27/2016  10:06 AM  PATIENT:  Eric Padilla  61 y.o. male  PRE-OPERATIVE DIAGNOSIS:  BILATERAL HYDRONEPHROSIS BLADDER MASS  POST-OPERATIVE DIAGNOSIS:  BILATERAL HYDRONEPHROSIS BLADDER MASS  PROCEDURE:  Procedure(s): TRANSURETHRAL RESECTION OF BLADDER TUMOR (TURBT) Greater than 5 (N/A)  SURGEON:  Surgeon(s) and Role:    * Festus Aloe, MD - Primary  PHYSICIAN ASSISTANT:   ASSISTANTS: none   ANESTHESIA:   general  EBL:  Total I/O In: 1000 [I.V.:1000] Out: -   BLOOD ADMINISTERED: NONE  DRAINS: Urinary Catheter (Foley)   LOCAL MEDICATIONS USED:  NONE  SPECIMEN:  Source of Specimen:  TURBT  DISPOSITION OF SPECIMEN:  PATHOLOGY  COUNTS:  YES  TOURNIQUET:  * No tourniquets in log *  DICTATION: .Dragon Dictation  PLAN OF CARE: Discharge to home after PACU  PATIENT DISPOSITION:  PACU - hemodynamically stable.   Delay start of Pharmacological VTE agent (>24hrs) due to surgical blood loss or risk of bleeding: yes

## 2016-08-27 NOTE — Anesthesia Postprocedure Evaluation (Signed)
Anesthesia Post Note  Patient: Eric Padilla  Procedure(s) Performed: Procedure(s) (LRB): TRANSURETHRAL RESECTION OF BLADDER TUMOR (TURBT) Greater than 5 (N/A)  Patient location during evaluation: PACU Anesthesia Type: General Level of consciousness: patient remains intubated per anesthesia plan Pain management: pain level controlled Vital Signs Assessment: post-procedure vital signs reviewed and stable Respiratory status: spontaneous breathing, nonlabored ventilation, respiratory function stable and patient connected to nasal cannula oxygen Cardiovascular status: blood pressure returned to baseline and stable Postop Assessment: no signs of nausea or vomiting Anesthetic complications: no       Last Vitals:  Vitals:   08/27/16 1417 08/27/16 1458  BP: (!) 102/55 104/67  Pulse: 93 (!) 57  Resp: 16 16  Temp: 36.6 C 36.4 C    Last Pain:  Vitals:   08/27/16 1458  TempSrc: Oral  PainSc: 5                  Aveleen Nevers DAVID

## 2016-09-06 ENCOUNTER — Encounter: Payer: Self-pay | Admitting: Gastroenterology

## 2016-09-06 ENCOUNTER — Ambulatory Visit (AMBULATORY_SURGERY_CENTER): Payer: Self-pay | Admitting: *Deleted

## 2016-09-06 VITALS — Ht 71.0 in | Wt 125.6 lb

## 2016-09-06 DIAGNOSIS — Z86010 Personal history of colonic polyps: Secondary | ICD-10-CM

## 2016-09-06 MED ORDER — NA SULFATE-K SULFATE-MG SULF 17.5-3.13-1.6 GM/177ML PO SOLN: ORAL | 0 refills | Status: DC

## 2016-09-06 NOTE — Progress Notes (Signed)
Pt denies allergies to eggs or soy products. Denies difficulty with sedation or anesthesia. Denies any diet or weight loss medications. Denies use of supplemental oxygen.  Emmi instructions given for procedure.  

## 2016-09-09 ENCOUNTER — Encounter: Payer: Self-pay | Admitting: Family

## 2016-09-19 ENCOUNTER — Other Ambulatory Visit: Payer: Self-pay | Admitting: Urology

## 2016-09-19 ENCOUNTER — Ambulatory Visit (INDEPENDENT_AMBULATORY_CARE_PROVIDER_SITE_OTHER)
Admission: RE | Admit: 2016-09-19 | Discharge: 2016-09-19 | Disposition: A | Discharge status: Home / Self Care | Payer: Medicare Other | Source: Ambulatory Visit | Attending: Family | Admitting: Family

## 2016-09-19 ENCOUNTER — Encounter: Payer: Self-pay | Admitting: Family

## 2016-09-19 ENCOUNTER — Ambulatory Visit (INDEPENDENT_AMBULATORY_CARE_PROVIDER_SITE_OTHER): Payer: Medicare Other | Admitting: Family

## 2016-09-19 ENCOUNTER — Ambulatory Visit (HOSPITAL_COMMUNITY)
Admission: RE | Admit: 2016-09-19 | Discharge: 2016-09-19 | Disposition: A | Discharge status: Home / Self Care | Payer: Medicare Other | Source: Ambulatory Visit | Attending: Family | Admitting: Family

## 2016-09-19 VITALS — BP 100/68 | HR 108 | Temp 96.9°F | Resp 18 | Ht 71.0 in | Wt 117.0 lb

## 2016-09-19 DIAGNOSIS — C672 Malignant neoplasm of lateral wall of bladder: Secondary | ICD-10-CM

## 2016-09-19 DIAGNOSIS — I70203 Unspecified atherosclerosis of native arteries of extremities, bilateral legs: Secondary | ICD-10-CM | POA: Diagnosis not present

## 2016-09-19 DIAGNOSIS — I779 Disorder of arteries and arterioles, unspecified: Secondary | ICD-10-CM

## 2016-09-19 DIAGNOSIS — R0989 Other specified symptoms and signs involving the circulatory and respiratory systems: Secondary | ICD-10-CM | POA: Diagnosis not present

## 2016-09-19 DIAGNOSIS — Z7722 Contact with and (suspected) exposure to environmental tobacco smoke (acute) (chronic): Secondary | ICD-10-CM

## 2016-09-19 DIAGNOSIS — Z87891 Personal history of nicotine dependence: Secondary | ICD-10-CM | POA: Diagnosis not present

## 2016-09-19 DIAGNOSIS — Z95828 Presence of other vascular implants and grafts: Secondary | ICD-10-CM

## 2016-09-19 DIAGNOSIS — N1330 Unspecified hydronephrosis: Secondary | ICD-10-CM

## 2016-09-19 DIAGNOSIS — I10 Essential (primary) hypertension: Secondary | ICD-10-CM | POA: Diagnosis not present

## 2016-09-19 NOTE — Patient Instructions (Signed)

## 2016-09-19 NOTE — Progress Notes (Signed)
[facility] [place]   CC: Follow up peripheral artery occlusive disease  History of Present Illness Eric Padilla is a 61 y.o. male patient of Dr. Oneida Alar who presents for follow-up evaluation for PAOD. He is s/p right to left femoral-femoral bypass graft, right lower extremity femoral-popliteal artery bypass graft with vein, and right popliteal endarterectomy performed 01/07/2011.  He is on aspirin for antiplatelet therapy. His atherosclerotic risk factors remain hypertension and former smoking. These are all currently stable and followed by his PCP.  He becomes dyspneic before he can walk far enough to claudicate, his dyspnea is worse than at his last visit with me.  He was walking about 20 minutes before both calves claudicate, relieved by rest; before he became too dyspneic, he could then continue walking for 1-2 miles with no claudication symptoms.  He denies non healing wounds. He denies any history of stroke or TIA.  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  Pt states his urologist is treating the superficial carbuncle/abscess in his left groin.   Pt Diabetic: No Pt smoker: formersmoker (quit in August, 2016, started smoking in his early 20's, was up to 2 ppd). However, he is exposed in his home to secondhand smoke.  Pt meds include: Statin :Yes Betablocker: Yes ASA: Yes Other anticoagulants/antiplatelets: no    Past Medical History:  Diagnosis Date  . Blood transfusion without reported diagnosis   . Carotid artery occlusion   . COPD (chronic obstructive pulmonary disease) ([place])   . Foot pain 11/22/2010   while lying flat  . Hyperlipidemia   . Hypertension   . Leg pain    with walking  . Peripheral arterial disease [facility])     Social History Social History  Substance Use Topics  . Smoking status: Former Smoker    Packs/day: 0.15    Years: 20.00    Types: Cigarettes    Start date: 03/30/2014    Quit date: 07/09/2016  .  Smokeless tobacco: Never Used  . Alcohol use 7.2 - 8.4 oz/week    12 - 14 Standard drinks or equivalent per week     Comment: daily    Family History Family History  Problem Relation Age of Onset  . Stroke Mother   . Heart disease Father   . Breast cancer Sister   . Colon cancer Neg Hx     Past Surgical History:  Procedure Laterality Date  . BLADDER SURGERY    . COLONOSCOPY  May 2015  . IR GENERIC HISTORICAL  08/09/2016   IR NEPHROSTOMY PLACEMENT LEFT 08/09/2016 Jacqulynn Cadet, MD [facility]-INTERV RAD  . PR VEIN BYPASS GRAFT,AORTO-FEM-POP  01/07/11   (L)-(R) fem-fem; (R) fem-AK pop BP  . TRANSURETHRAL RESECTION OF BLADDER TUMOR N/A 08/27/2016   Procedure: TRANSURETHRAL RESECTION OF BLADDER TUMOR (TURBT) Greater than 5;  Surgeon: Festus Aloe, MD;  Location: [facility];  Service: Urology;  Laterality: N/A;    No Known Allergies  Current Outpatient Prescriptions  Medication Sig Dispense Refill  . aspirin EC 81 MG tablet Take 1 tablet (81 mg total) by mouth daily. 90 tablet 3  . atorvastatin (LIPITOR) 40 MG tablet Take 1 tablet (40 mg total) by mouth daily. 90 tablet 3  . hydrochlorothiazide (HYDRODIURIL) 50 MG tablet TAKE 1 TABLET BY MOUTH EVERY DAY 90 tablet 3  . losartan (COZAAR) 25 MG tablet Take 25 mg by mouth daily.    . metoprolol succinate (TOPROL-XL) 100 MG 24 hr tablet Take 1 tablet (100  mg total) by mouth daily. Take with or immediately following a meal. 90 tablet 3  . Multiple Vitamin (MULTIVITAMIN WITH MINERALS) TABS tablet Take 1 tablet by mouth daily.    . Na Sulfate-K Sulfate-Mg Sulf 17.5-3.13-1.6 GM/180ML SOLN Suprep as directed.  No substitutions. 354 mL 0  . tamsulosin (FLOMAX) 0.4 MG CAPS capsule Take 1 capsule (0.4 mg total) by mouth daily. 30 capsule 11   No current facility-administered medications for this visit.     ROS: See HPI for pertinent positives and negatives.   Physical Examination  Vitals:   09/19/16 1117  BP: 100/68  Pulse: (!) 108  Resp: 18   Temp: (!) 96.9 F (36.1 C)  SpO2: 100%  Weight: 117 lb (53.1 kg)  Height: 5\' 11"  (1.803 m)   Body mass index is 16.32 kg/m.  General: A&O x 3, thin, chronically ill appearing male. Hygienically challenged.  Gait: normal Eyes: PERRLA. Pulmonary: Respirations are non labored, limited air movement in all fields with inspiratory and expiratory wheezes in all fields. No rales or rhonchi.  + moist cough.  Cardiac: regular rhythm, mild tachycardia at 108/min, no detected murmur.     Carotid Bruits Right Left   positive Negative   Aorta is faintly palpable (he is very thin) Radial pulses: are 2+ palpable and = Fem-fem bypass graft pulse is strongly palpable at pubis   VASCULAR EXAM: Extremitieswithout ischemic changes, without Gangrene; without open wounds in his feet or legs. He does have a multilobed small carbuncle in left groin; one of the small lobes is draining a scant amount of purulent material; mild surrounding erythema, minimal swelling surrounding this lesion.       LE Pulses Right Left   FEMORAL  palpable  palpable    POPLITEAL not palpable  not palpable   POSTERIOR TIBIAL faintly palpable  not palpable, monophasic waveforms by Doppler    DORSALIS PEDIS  ANTERIOR TIBIAL not palpable, monophasic waveforms by Doppler  not palpable, monophasic waveforms by Doppler    Abdomen: soft, NT, no palpable masses. Left posterior flank urostomy.  Skin: no rashes, no ulcers. Musculoskeletal: no muscle wasting or atrophy. Neurologic: A&O X 3; Appropriate Affect ; SENSATION: normal; MOTOR FUNCTION: moving all extremities equally, motor strength 5/5 throughout. Speech is fluent/normal.  CN 2-12 intact.    ASSESSMENT: Eric Padilla is a 61 y.o. male who is s/p right to left femoral-femoral bypass graft, right lower extremity femoral-popliteal artery bypass graft with vein and right popliteal endarterectomy performed 01/07/2011. He has a right carotid bruit, no history of stroke or TIA.  He was recently diagnosed with bladder cancer and currently has a left nephrostomy.  Pt states his urologist is addressing the superficial draining carbuncle in his left groin. He is cachectic appearing.   His dyspnea limits his walking before claudication can be elicited. Prior to this he had mild bilateral calf claudication with walking and he was walking 2-3 miles daily. His atherosclerotic risk factors include former smoker and current exposure to secondhand smoke in his home. Fortunately he does not have DM, he takes a daily statin and ASA.   DATA  Today's right LE arterial duplex suggests a patent right femoral to popliteal artery bypass graft, no hemodynamically significant stenosis. Elevated distal external iliac artery velocity of (240 cm/s) (was 275 cm/s on 03-07-16) No internal vessel narrowing noted within the bypass graft or anastomosis.  Hypoechoic area surrounding the proximal right to left fem-fem bypass graft. No significant change compared to  duplex exam on 03-07-16.  ABI: Right: 0.80 (0.85, 03-07-16), waveforms: PT: biphasic, DP: monophasic; TBI: 0.71 Left: 0.54 (0.58, 03-07-16), waveforms: monophasic; TBI: 0.08 Bilateral ABI's are stable: mild arterial occlusive disease in the right, moderate in the left.    03-07-16 carotid duplex suggests <40% bilateral ICA stenoses. Both vertebral arteries are antegrade, both subclavian arteries are multiphasic (normal).    PLAN:   Based on today's exam and non-invasive vascular lab results, the patient will follow up in 1 year with the following tests: ABI's and right LE arterial duplex, with attention to right EIA stenosis. I advised pt to notify us if he develops  concerns re the circulation in his feet/legs.   Graduated walking program discussed and how to achieve, will also help his breathing. Carotid duplex in 1-2 years.    I discussed in depth with the patient the nature of atherosclerosis, and emphasized the importance of maximal medical management including strict control of blood pressure, blood glucose, and lipid levels, obtaining regular exercise, and continued cessation of smoking.  The patient is aware that without maximal medical management the underlying atherosclerotic disease process will progress, limiting the benefit of any interventions.  The patient was given information about PAD including signs, symptoms, treatment, what symptoms should prompt the patient to seek immediate medical care, and risk reduction measures to take.  Clemon Chambers, RN, MSN, FNP-C [facility] [employer] Phone: [phone]  Clinic MD: [facility]  09/19/16 11:34 AM

## 2016-09-20 ENCOUNTER — Ambulatory Visit (AMBULATORY_SURGERY_CENTER): Payer: Medicare Other | Admitting: Gastroenterology

## 2016-09-20 ENCOUNTER — Encounter: Payer: Self-pay | Admitting: Gastroenterology

## 2016-09-20 ENCOUNTER — Other Ambulatory Visit: Payer: Self-pay | Admitting: Urology

## 2016-09-20 VITALS — BP 118/71 | HR 97 | Temp 100.0°F | Resp 22 | Ht 71.0 in | Wt 125.0 lb

## 2016-09-20 DIAGNOSIS — Z86010 Personal history of colonic polyps: Secondary | ICD-10-CM

## 2016-09-20 DIAGNOSIS — C801 Malignant (primary) neoplasm, unspecified: Secondary | ICD-10-CM

## 2016-09-20 DIAGNOSIS — D122 Benign neoplasm of ascending colon: Secondary | ICD-10-CM

## 2016-09-20 DIAGNOSIS — D126 Benign neoplasm of colon, unspecified: Secondary | ICD-10-CM | POA: Diagnosis not present

## 2016-09-20 DIAGNOSIS — D128 Benign neoplasm of rectum: Secondary | ICD-10-CM | POA: Diagnosis not present

## 2016-09-20 DIAGNOSIS — D120 Benign neoplasm of cecum: Secondary | ICD-10-CM

## 2016-09-20 DIAGNOSIS — K635 Polyp of colon: Secondary | ICD-10-CM

## 2016-09-20 DIAGNOSIS — D125 Benign neoplasm of sigmoid colon: Secondary | ICD-10-CM

## 2016-09-20 DIAGNOSIS — D124 Benign neoplasm of descending colon: Secondary | ICD-10-CM

## 2016-09-20 MED ORDER — SODIUM CHLORIDE 0.9 % IV SOLN: 500.0000 mL | INTRAVENOUS | Status: DC

## 2016-09-20 NOTE — Patient Instructions (Signed)
YOU HAD AN ENDOSCOPIC PROCEDURE TODAY AT [facility]:   Refer to the procedure report that was given to you for any specific questions about what was found during the examination.  If the procedure report does not answer your questions, please call your gastroenterologist to clarify.  If you requested that your care partner not be given the details of your procedure findings, then the procedure report has been included in a sealed envelope for you to review at your convenience later.  YOU SHOULD EXPECT: Some feelings of bloating in the abdomen. Passage of more gas than usual.  Walking can help get rid of the air that was put into your GI tract during the procedure and reduce the bloating. If you had a lower endoscopy (such as a colonoscopy or flexible sigmoidoscopy) you may notice spotting of blood in your stool or on the toilet paper. If you underwent a bowel prep for your procedure, you may not have a normal bowel movement for a few days.  Please Note:  You might notice some irritation and congestion in your nose or some drainage.  This is from the oxygen used during your procedure.  There is no need for concern and it should clear up in a day or so.  SYMPTOMS TO REPORT IMMEDIATELY:   Following lower endoscopy (colonoscopy or flexible sigmoidoscopy):  Excessive amounts of blood in the stool  Significant tenderness or worsening of abdominal pains  Swelling of the abdomen that is new, acute  Fever of 100F or higher   For urgent or emergent issues, a gastroenterologist can be reached at any hour by calling [phone].   DIET:  We do recommend a small meal at first, but then you may proceed to your regular diet.  Drink plenty of fluids but you should avoid alcoholic beverages for 24 hours.  ACTIVITY:  You should plan to take it easy for the rest of today and you should NOT DRIVE or use heavy machinery until tomorrow (because of the sedation medicines used during the test).     FOLLOW UP: Our staff will call the number listed on your records the next business day following your procedure to check on you and address any questions or concerns that you may have regarding the information given to you following your procedure. If we do not reach you, we will leave a message.  However, if you are feeling well and you are not experiencing any problems, there is no need to return our call.  We will assume that you have returned to your regular daily activities without incident.  If any biopsies were taken you will be contacted by phone or by letter within the next 1-3 weeks.  Please call us at [phone] if you have not heard about the biopsies in 3 weeks.    SIGNATURES/CONFIDENTIALITY: You and/or your care partner have signed paperwork which will be entered into your electronic medical record.  These signatures attest to the fact that that the information above on your After Visit Summary has been reviewed and is understood.  Full responsibility of the confidentiality of this discharge information lies with you and/or your care-partner.  Polyp, diverticulosis, high fiber diet information given. Hemorrhoid information given.

## 2016-09-20 NOTE — Op Note (Signed)
Eric Padilla: Eric Padilla Procedure Date: 09/20/2016 9:08 AM MRN: [id number] Endoscopist: Remo Lipps P. Armbruster MD, MD Age: 61 Referring MD:  Date of Birth: Oct 01, 1955 Gender: Male Account #: [id number] Procedure:                Colonoscopy Indications:              Surveillance: Personal history of adenomatous                            polyps on last colonoscopy 3 years ago Medicines:                Monitored Anesthesia Care Procedure:                Pre-Anesthesia Assessment:                           - Prior to the procedure, a History and Physical                            was performed, and patient medications and                            allergies were reviewed. The patient's tolerance of                            previous anesthesia was also reviewed. The risks                            and benefits of the procedure and the sedation                            options and risks were discussed with the patient.                            All questions were answered, and informed consent                            was obtained. Prior Anticoagulants: The patient has                            taken aspirin, last dose was 1 day prior to                            procedure. ASA Grade Assessment: III - A patient                            with severe systemic disease. After reviewing the                            risks and benefits, the patient was deemed in                            satisfactory condition to undergo the procedure.  After obtaining informed consent, the colonoscope                            was passed under direct vision. Throughout the                            procedure, the patient's blood pressure, pulse, and                            oxygen saturations were monitored continuously. The                            Colonoscope was introduced through the anus and                            advanced to the the  cecum, identified by                            appendiceal orifice and ileocecal valve. The                            colonoscopy was performed without difficulty. The                            patient tolerated the procedure well. The quality                            of the bowel preparation was good. The ileocecal                            valve, appendiceal orifice, and rectum were                            photographed. Scope In: 9:16:09 AM Scope Out: 9:33:56 AM Scope Withdrawal Time: 0 hours 14 minutes 16 seconds  Total Procedure Duration: 0 hours 17 minutes 47 seconds  Findings:                 The perianal and digital rectal examinations were                            normal.                           A 5 mm polyp was found in the cecum. The polyp was                            sessile. The polyp was removed with a cold snare.                            Resection and retrieval were complete.                           A 4 mm polyp was found in the ascending colon. The  polyp was sessile. The polyp was removed with a                            cold snare. Resection and retrieval were complete.                           A 4 mm polyp was found in the descending colon. The                            polyp was sessile. The polyp was removed with a                            cold snare. Resection and retrieval were complete.                           A 3 mm polyp was found in the sigmoid colon. The                            polyp was sessile. The polyp was removed with a                            cold snare. Resection and retrieval were complete.                           Many medium-mouthed diverticula were found in the                            entire colon.                           The colon (entire examined portion) was tortuous.                           Internal hemorrhoids were found during retroflexion.                           The exam was  otherwise without abnormality. 2                            tattoos were noted in the rectosigmoid colon                            without any residual polypoid tissue noted. Complications:            No immediate complications. Estimated blood loss:                            Minimal. Estimated Blood Loss:     Estimated blood loss was minimal. Impression:               - One 5 mm polyp in the cecum, removed with a cold                            snare. Resected and  retrieved.                           - One 4 mm polyp in the ascending colon, removed                            with a cold snare. Resected and retrieved.                           - One 4 mm polyp in the descending colon, removed                            with a cold snare. Resected and retrieved.                           - One 3 mm polyp in the sigmoid colon, removed with                            a cold snare. Resected and retrieved.                           - Diverticulosis in the entire examined colon.                           - Tortuous colon.                           - Internal hemorrhoids.                           - The examination was otherwise normal. Recommendation:           - Patient has a contact number available for                            emergencies. The signs and symptoms of potential                            delayed complications were discussed with the                            patient. Return to normal activities tomorrow.                            Written discharge instructions were provided to the                            patient.                           - Resume previous diet.                           - Continue present medications.                           -  Await pathology results.                           - Repeat colonoscopy is recommended for                            surveillance. The colonoscopy date will be                            determined after pathology results  from today's                            exam become available for review.                           - No ibuprofen, naproxen, or other non-steroidal                            anti-inflammatory drugs for 2 weeks after polyp                            removal. Remo Lipps P. Armbruster MD, MD 09/20/2016 9:40:26 AM This report has been signed electronically.

## 2016-09-20 NOTE — Progress Notes (Signed)
To recovery, report to Scott, RN, VSS 

## 2016-09-20 NOTE — Addendum Note (Signed)
Addended by: Lianne Cure A on: 09/20/2016 04:19 PM   Modules accepted: Orders

## 2016-09-20 NOTE — Progress Notes (Signed)
Pt's states no medical or surgical changes since previsit or office visit. 

## 2016-09-20 NOTE — Progress Notes (Signed)
Called to room to assist during endoscopic procedure.  Patient ID and intended procedure confirmed with present staff. Received instructions for my participation in the procedure from the performing physician.  

## 2016-09-23 ENCOUNTER — Telehealth: Payer: Self-pay | Admitting: *Deleted

## 2016-09-23 NOTE — Telephone Encounter (Signed)
  Follow up Call-  Call back number 09/20/2016  Post procedure Call Back phone  # [phone]  Permission to leave phone message Yes  Some recent data might be hidden     Patient questions:  Do you have a fever, pain , or abdominal swelling? No. Pain Score  0 *  Have you tolerated food without any problems? yes  Have you been able to return to your normal activities? Yes.    Do you have any questions about your discharge instructions: Diet   No. Medications  No. Follow up visit  No.  Do you have questions or concerns about your Care? No.  Actions: * If pain score is 4 or above: No action needed, pain <4.

## 2016-09-24 ENCOUNTER — Encounter: Payer: Self-pay | Admitting: Gastroenterology

## 2016-09-25 ENCOUNTER — Other Ambulatory Visit: Payer: Self-pay | Admitting: Urology

## 2016-09-25 ENCOUNTER — Encounter (HOSPITAL_COMMUNITY): Payer: Self-pay | Admitting: Interventional Radiology

## 2016-09-25 ENCOUNTER — Ambulatory Visit (HOSPITAL_COMMUNITY)
Admission: RE | Admit: 2016-09-25 | Discharge: 2016-09-25 | Disposition: A | Discharge status: Home / Self Care | Payer: Medicare Other | Source: Ambulatory Visit | Attending: Urology | Admitting: Urology

## 2016-09-25 DIAGNOSIS — C679 Malignant neoplasm of bladder, unspecified: Secondary | ICD-10-CM | POA: Diagnosis not present

## 2016-09-25 DIAGNOSIS — N135 Crossing vessel and stricture of ureter without hydronephrosis: Secondary | ICD-10-CM | POA: Diagnosis not present

## 2016-09-25 DIAGNOSIS — C672 Malignant neoplasm of lateral wall of bladder: Secondary | ICD-10-CM

## 2016-09-25 DIAGNOSIS — N1330 Unspecified hydronephrosis: Secondary | ICD-10-CM

## 2016-09-25 DIAGNOSIS — Z436 Encounter for attention to other artificial openings of urinary tract: Secondary | ICD-10-CM | POA: Diagnosis present

## 2016-09-25 HISTORY — PX: IR NEPHROSTOMY EXCHANGE LEFT: IMG6069

## 2016-09-25 MED ORDER — IOPAMIDOL (ISOVUE-300) INJECTION 61%: 10.0000 mL | Freq: Once | INTRAVENOUS | Status: DC | PRN

## 2016-09-25 MED ORDER — LIDOCAINE HCL 1 % IJ SOLN
INTRAMUSCULAR | Status: AC
  Filled 2016-09-25: qty 20

## 2016-09-25 MED ORDER — LIDOCAINE HCL 1 % IJ SOLN
INTRAMUSCULAR | Status: AC | PRN
  Administered 2016-09-25: 10 mL via INTRADERMAL

## 2016-09-25 MED ORDER — IOPAMIDOL (ISOVUE-300) INJECTION 61%
INTRAVENOUS | Status: AC
  Filled 2016-09-25: qty 50

## 2016-10-02 ENCOUNTER — Other Ambulatory Visit: Payer: Self-pay | Admitting: Radiology

## 2016-10-03 ENCOUNTER — Other Ambulatory Visit: Payer: Self-pay | Admitting: Student

## 2016-10-04 ENCOUNTER — Other Ambulatory Visit: Payer: Self-pay | Admitting: Urology

## 2016-10-04 ENCOUNTER — Encounter (HOSPITAL_COMMUNITY): Payer: Self-pay

## 2016-10-04 ENCOUNTER — Ambulatory Visit (HOSPITAL_COMMUNITY)
Admission: RE | Admit: 2016-10-04 | Discharge: 2016-10-04 | Disposition: A | Discharge status: Home / Self Care | Payer: Medicare Other | Source: Ambulatory Visit | Attending: Urology | Admitting: Urology

## 2016-10-04 DIAGNOSIS — C679 Malignant neoplasm of bladder, unspecified: Secondary | ICD-10-CM | POA: Diagnosis present

## 2016-10-04 DIAGNOSIS — Z7982 Long term (current) use of aspirin: Secondary | ICD-10-CM | POA: Insufficient documentation

## 2016-10-04 DIAGNOSIS — Z79899 Other long term (current) drug therapy: Secondary | ICD-10-CM | POA: Insufficient documentation

## 2016-10-04 DIAGNOSIS — E785 Hyperlipidemia, unspecified: Secondary | ICD-10-CM | POA: Insufficient documentation

## 2016-10-04 DIAGNOSIS — Z87891 Personal history of nicotine dependence: Secondary | ICD-10-CM | POA: Diagnosis not present

## 2016-10-04 DIAGNOSIS — J449 Chronic obstructive pulmonary disease, unspecified: Secondary | ICD-10-CM | POA: Insufficient documentation

## 2016-10-04 DIAGNOSIS — C801 Malignant (primary) neoplasm, unspecified: Secondary | ICD-10-CM

## 2016-10-04 DIAGNOSIS — E876 Hypokalemia: Secondary | ICD-10-CM | POA: Insufficient documentation

## 2016-10-04 DIAGNOSIS — R000 Tachycardia, unspecified: Secondary | ICD-10-CM | POA: Insufficient documentation

## 2016-10-04 DIAGNOSIS — I739 Peripheral vascular disease, unspecified: Secondary | ICD-10-CM | POA: Diagnosis not present

## 2016-10-04 DIAGNOSIS — I10 Essential (primary) hypertension: Secondary | ICD-10-CM | POA: Diagnosis not present

## 2016-10-04 HISTORY — DX: Dizziness and giddiness: R42

## 2016-10-04 LAB — CBC WITH DIFFERENTIAL/PLATELET
BASOS ABS: 0 10*3/uL (ref 0.0–0.1)
BASOS PCT: 0 %
EOS ABS: 0.1 10*3/uL (ref 0.0–0.7)
Eosinophils Relative: 1 %
HCT: 35.5 % — ABNORMAL LOW (ref 39.0–52.0)
HEMOGLOBIN: 12.4 g/dL — AB (ref 13.0–17.0)
Lymphocytes Relative: 8 %
Lymphs Abs: 1.4 10*3/uL (ref 0.7–4.0)
MCH: 32.4 pg (ref 26.0–34.0)
MCHC: 34.9 g/dL (ref 30.0–36.0)
MCV: 92.7 fL (ref 78.0–100.0)
MONOS PCT: 5 %
Monocytes Absolute: 0.9 10*3/uL (ref 0.1–1.0)
NEUTROS PCT: 86 %
Neutro Abs: 16.3 10*3/uL — ABNORMAL HIGH (ref 1.7–7.7)
Platelets: 643 10*3/uL — ABNORMAL HIGH (ref 150–400)
RBC: 3.83 MIL/uL — AB (ref 4.22–5.81)
RDW: 14.3 % (ref 11.5–15.5)
WBC: 18.8 10*3/uL — AB (ref 4.0–10.5)

## 2016-10-04 LAB — PROTIME-INR
INR: 0.98
PROTHROMBIN TIME: 12.9 s (ref 11.4–15.2)

## 2016-10-04 LAB — BASIC METABOLIC PANEL
Anion gap: 12 (ref 5–15)
BUN: 14 mg/dL (ref 6–20)
CALCIUM: 9.4 mg/dL (ref 8.9–10.3)
CO2: 26 mmol/L (ref 22–32)
CREATININE: 1.17 mg/dL (ref 0.61–1.24)
Chloride: 97 mmol/L — ABNORMAL LOW (ref 101–111)
Glucose, Bld: 116 mg/dL — ABNORMAL HIGH (ref 65–99)
Potassium: 2.6 mmol/L — CL (ref 3.5–5.1)
SODIUM: 135 mmol/L (ref 135–145)

## 2016-10-04 MED ORDER — POTASSIUM CHLORIDE CRYS ER 20 MEQ PO TBCR
40.0000 meq | EXTENDED_RELEASE_TABLET | Freq: Once | ORAL | Status: AC
Start: 2016-10-04 — End: 2016-10-04
  Administered 2016-10-04: 40 meq via ORAL
  Filled 2016-10-04: qty 2

## 2016-10-04 MED ORDER — SODIUM CHLORIDE 0.9 % IV SOLN
INTRAVENOUS | Status: DC
  Administered 2016-10-04: 12:00:00 via INTRAVENOUS

## 2016-10-04 MED ORDER — MIDAZOLAM HCL 2 MG/2ML IJ SOLN
INTRAMUSCULAR | Status: AC
  Filled 2016-10-04: qty 6

## 2016-10-04 MED ORDER — FENTANYL CITRATE (PF) 100 MCG/2ML IJ SOLN
INTRAMUSCULAR | Status: AC
  Filled 2016-10-04: qty 2

## 2016-10-04 NOTE — Sedation Documentation (Signed)
No sedation required for procedure. Area was numbed locally and aspirated. Right inguinal.   Pt in No distress. Tolerated well.

## 2016-10-04 NOTE — Discharge Instructions (Signed)
Aspiration, Care After These instructions give you information about caring for yourself after your procedure. Your doctor may also give you more specific instructions. Call your doctor if you have any problems or questions after your procedure. Follow these instructions at home:  Rest as told by your doctor.  Take medicines only as told by your doctor.  There are many different ways to close and cover the biopsy site, including stitches (sutures), skin glue, and adhesive strips. Follow instructions from your doctor about:  How to take care of your biopsy site.  When and how you should change your bandage (dressing).  When you should remove your dressing.  Removing whatever was used to close your biopsy site.  Check your biopsy site every day for signs of infection. Watch for:  Redness, swelling, or pain.  Fluid, blood, or pus. Contact a doctor if:  You have a fever.  You have redness, swelling, or pain at the biopsy site, and it lasts longer than a few days.  You have fluid, blood, or pus coming from the biopsy site.  You feel sick to your stomach (nauseous).  You throw up (vomit). Get help right away if:  You are short of breath.  You have trouble breathing.  Your chest hurts.  You feel dizzy or you pass out (faint).  You have bleeding that does not stop with pressure or a bandage.  You cough up blood.  Your belly (abdomen) hurts. This information is not intended to replace advice given to you by your health care provider. Make sure you discuss any questions you have with your health care provider. Document Released: 05/02/2008 Document Revised: 10/26/2015 Document Reviewed: 05/16/2014 Elsevier Interactive Patient Education  2017 [employer].

## 2016-10-04 NOTE — Procedures (Signed)
Bilateral complex inguinal fluid collections by ultrasound about the femoral-femoral bypass graft concerning for infection/abscess.  Ultrasound left inguinal perigraft Fluid collection aspiration   20 mL exudative fluid aspirated.  Sample sent for Gram stain, culture and cytology  EBL 0  Full report in PACs

## 2016-10-04 NOTE — Progress Notes (Signed)
CRITICAL VALUE ALERT  Critical value received: Potassium 2.6 called per lab  Date of notification:  10/04/2016  Time of notification:  12:27 pm   Critical value read back: yes   Nurse who received alert:  Nash Dimmer RN   MD notified (1st page): spoke with Brynda Greathouse PA was to notify Dr [id number] pm

## 2016-10-04 NOTE — H&P (Signed)
Chief Complaint: Patient was seen in consultation today for bladder cancer  Referring Physician(s):  Eskridge,Matthew  Supervising Physician: Daryll Brod  Patient Status: [facility] - Out-pt  History of Present Illness: Eric Padilla is a 61 y.o. male with past medical history of COPD, HLD, HTN, PAD, sinus tachycardia, and bladder cancer who presents for inguinal lymph node biopsies.  He does have a left nephrostomy tube which was last exchanged 09/25/16 by Dr. Vernard Gambles.   Patient has been NPO.  He does not take blood thinners.  He has been in his usual state of health.  Reports he does have bladder pain which is typical for him.   Past Medical History:  Diagnosis Date  . Blood transfusion without reported diagnosis   . Carotid artery occlusion   . COPD (chronic obstructive pulmonary disease) ([place])   . Dizziness   . Foot pain 11/22/2010   while lying flat  . Hyperlipidemia   . Hypertension   . Leg pain    with walking  . Peripheral arterial disease [facility])     Past Surgical History:  Procedure Laterality Date  . BLADDER SURGERY    . COLONOSCOPY  May 2015  . IR GENERIC HISTORICAL  08/09/2016   IR NEPHROSTOMY PLACEMENT LEFT 08/09/2016 Jacqulynn Cadet, MD [facility]-INTERV RAD  . IR NEPHROSTOMY EXCHANGE LEFT  09/25/2016  . PR VEIN BYPASS GRAFT,AORTO-FEM-POP  01/07/11   (L)-(R) fem-fem; (R) fem-AK pop BP  . TRANSURETHRAL RESECTION OF BLADDER TUMOR N/A 08/27/2016   Procedure: TRANSURETHRAL RESECTION OF BLADDER TUMOR (TURBT) Greater than 5;  Surgeon: Festus Aloe, MD;  Location: [facility] ORS;  Service: Urology;  Laterality: N/A;    Allergies: Patient has no known allergies.  Medications: Prior to Admission medications   Medication Sig Start Date End Date Taking? Authorizing Provider  aspirin EC 81 MG tablet Take 1 tablet (81 mg total) by mouth daily. 12/22/14  Yes Patrecia Pour, MD  atorvastatin (LIPITOR) 40 MG tablet Take 1 tablet (40 mg total) by mouth daily. 04/15/16  Yes [place] N Rumley, DO    hydrochlorothiazide (HYDRODIURIL) 50 MG tablet TAKE 1 TABLET BY MOUTH EVERY DAY 08/26/16  Yes Pierce N Rumley, DO  losartan (COZAAR) 25 MG tablet Take 25 mg by mouth daily.   Yes Historical Provider, MD  metoprolol succinate (TOPROL-XL) 100 MG 24 hr tablet Take 1 tablet (100 mg total) by mouth daily. Take with or immediately following a meal. 06/18/16  Yes [place] N Rumley, DO  Multiple Vitamin (MULTIVITAMIN WITH MINERALS) TABS tablet Take 1 tablet by mouth daily.   Yes Historical Provider, MD  tamsulosin (FLOMAX) 0.4 MG CAPS capsule Take 1 capsule (0.4 mg total) by mouth daily. 08/11/16  Yes Sela Hua, MD     Family History  Problem Relation Age of Onset  . Stroke Mother   . Heart disease Father   . Breast cancer Sister   . Colon cancer Neg Hx     Social History   Social History  . Marital status: Divorced    Spouse name: N/A  . Number of children: N/A  . Years of education: N/A   Social History Main Topics  . Smoking status: Former Smoker    Packs/day: 0.15    Years: 20.00    Types: Cigarettes    Start date: 03/30/2014    Quit date: 07/09/2016  . Smokeless tobacco: Never Used  . Alcohol use 7.2 - 8.4 oz/week    12 - 14 Standard drinks or equivalent  per week     Comment: occas  . Drug use: No  . Sexual activity: Not Currently   Other Topics Concern  . None   Social History Narrative  . None    Review of Systems  Constitutional: Negative for fatigue and fever.  Respiratory: Negative for cough and shortness of breath.   Cardiovascular: Negative for chest pain.  Genitourinary: Positive for dysuria (chronic for the past 2 months).  Psychiatric/Behavioral: Negative for behavioral problems and confusion.    Vital Signs: BP 135/80   Pulse 72   Temp 98 F (36.7 C) (Oral)   Resp 16   Ht 5\' 11"  (1.803 m)   Wt 116 lb (52.6 kg)   SpO2 98%   BMI 16.18 kg/m   Physical Exam  Constitutional: He appears well-developed.  Cardiovascular: Normal rate, regular rhythm  and normal heart sounds.   Pulmonary/Chest: Effort normal and breath sounds normal. No respiratory distress.  Genitourinary:  Genitourinary Comments: Nephrostomy tube in place. Clear, yellow urine in bag  Nursing note and vitals reviewed.   Mallampati Score:  MD Evaluation Airway: WNL Heart: WNL Abdomen: WNL Chest/ Lungs: WNL ASA  Classification: 3 Mallampati/Airway Score: Two  Imaging: Ir Nephrostomy Exchange Left  Result Date: 09/25/2016 CLINICAL DATA:  Bladder carcinoma with ureteral obstruction, status post percutaneous nephrostomy catheter placement by IR 08/09/2016, working well. Routine exchange requested. EXAM: LEFT PERCUTANEOUS NEPHROSTOMY CATHETER EXCHANGE UNDER FLUOROSCOPY FLUOROSCOPY TIME:  24 seconds, 1 mGy TECHNIQUE: The procedure, risks (including but not limited to bleeding, infection, organ damage ), benefits, and alternatives were explained to the patient. Questions regarding the procedure were encouraged and answered. The patient understands and consents to the procedure. The nephrostomy tube and surrounding skin were prepped with Betadine, draped in usual sterile fashion. 1% lidocaine administered subcutaneously. A small amount of contrast was injected through the left nephrostomy catheter to opacify the renal collecting system. The catheter was cut and exchanged over a 0.035" angiographic wire for a new 10-French pigtail catheter, formed centrally within the collecting system under fluoroscopy. Contrast injection confirms appropriate positioning. Catheter was secured externally with a Statlock device and 0-Prolene sutures. The patient tolerated the procedure well. COMPLICATIONS: None. IMPRESSION: 1. Technically successful exchange of left nephrostomy catheter under fluoroscopy Electronically Signed   By: Lucrezia Europe M.D.   On: 09/25/2016 13:29    Labs:  CBC:  Recent Labs  08/10/16 0535 08/11/16 0509 08/19/16 1336 10/04/16 1141  WBC 11.2* 10.1 15.3* 18.8*  HGB  10.7* 10.5* 11.3* 12.4*  HCT 31.9* 31.2* 33.0* 35.5*  PLT 364 377 606* 643*    COAGS:  Recent Labs  08/09/16 0900 10/04/16 1141  INR 1.02 0.98    BMP:  Recent Labs  08/11/16 0509 08/19/16 1336 08/27/16 1054 10/04/16 1141  NA 133* 138 135 135  K 3.9 4.8 4.0 2.6*  CL 100* 104 102 97*  CO2 26 23 27 26   GLUCOSE 110* 103* 114* 116*  BUN 20 15 12 14   CALCIUM 8.9 9.7 8.8* 9.4  CREATININE 1.48* 1.36* 1.30* 1.17  GFRNONAA 49* 54* 57* >60  GFRAA 57* >60 >60 >60    LIVER FUNCTION TESTS:  Recent Labs  06/18/16 1020 07/05/16 1226 08/07/16 1809 08/27/16 1054  BILITOT 0.5 0.6 0.8 0.6  AST 14 11 18  13*  ALT 5 7 11* 8*  ALKPHOS 162* 181* 143* 98  PROT 7.5 7.4 6.9 6.1*  ALBUMIN 4.3 4.3 3.2* 2.8*    TUMOR MARKERS: No results for input(s): AFPTM, CEA,  CA199, CHROMGRNA in the last 8760 hours.  Assessment and Plan: Patient with past medical history of bladder cancer presents for inguinal lymph node biopsy at the request of Dr. Junious Silk.  Patient presents for procedure today. He states he is in his usual state of health.  Upon initial assessment, he was found to have tachycardia. He recently had sinus tachycardia identified by EKG during a procedure 09/20/16.  After rest, his heart rate decreased to 70s. He is asymptomatic. He states she has bladder pain but this is chronic and which has improved with nephrostomy placement.   Routine labs drawn prior to procedure show a low potassium level of 2.6.  Patient to be given 72mEq PO.  Risks and Benefits discussed with the patient including, but not limited to bleeding, infection, damage to adjacent structures or low yield requiring additional tests. All of the patient's questions were answered, patient is agreeable to proceed. Consent signed and in chart.   Thank you for this interesting consult.  I greatly enjoyed meeting Eric Padilla and look forward to participating in their care.  A copy of this report was sent to the requesting  provider on this date.  Electronically Signed: Docia Barrier 10/04/2016, 12:48 PM   I spent a total of    15 Minutes in face to face in clinical consultation, greater than 50% of which was counseling/coordinating care for inguinal node biopsies.

## 2016-10-09 ENCOUNTER — Telehealth: Payer: Self-pay | Admitting: Vascular Surgery

## 2016-10-09 LAB — AEROBIC/ANAEROBIC CULTURE W GRAM STAIN (SURGICAL/DEEP WOUND)
Culture: NO GROWTH
Special Requests: NORMAL

## 2016-10-09 LAB — AEROBIC/ANAEROBIC CULTURE (SURGICAL/DEEP WOUND)

## 2016-10-09 NOTE — Telephone Encounter (Signed)
-----   Message from Mena Goes, RN sent at 10/08/2016 10:45 PM EDT ----- Regarding: Thursday with Dr. Oneida Alar   ----- Message ----- From: Serafina Mitchell, MD Sent: 10/08/2016   6:34 PM To: [facility] Charge Pool  Spoke with Urology today.  Patient has bladder cancer and during surgery, they found drainage near a groin incision.  He was placed on Abx.  Can we get him into the office on Thursday for a wound check.  He can see a PA.  He is a patient of Dr. Oneida Alar.  No BPG was visulaized

## 2016-10-09 NOTE — Telephone Encounter (Signed)
Spoke to pt, ok'd time for appt tomorrow 10/10/16 at 2:30 with PA for wound check

## 2016-10-10 ENCOUNTER — Ambulatory Visit (INDEPENDENT_AMBULATORY_CARE_PROVIDER_SITE_OTHER): Payer: Medicare Other | Admitting: Physician Assistant

## 2016-10-10 ENCOUNTER — Encounter: Payer: Self-pay | Admitting: Physician Assistant

## 2016-10-10 VITALS — BP 135/80 | HR 121 | Resp 16 | Ht 71.0 in | Wt 116.0 lb

## 2016-10-10 DIAGNOSIS — L24A9 Irritant contact dermatitis due friction or contact with other specified body fluids: Secondary | ICD-10-CM

## 2016-10-10 DIAGNOSIS — T148XXA Other injury of unspecified body region, initial encounter: Secondary | ICD-10-CM

## 2016-10-10 NOTE — Progress Notes (Signed)
HISTORY AND PHYSICAL     CC:  Drainage from groin incision Referring Provider:  Lorna Few, DO  HPI: This is a 61 y.o. male of Dr. Oneida Alar who has had a left to right femoral to femoral bypass graft with 64mm Dacron and a right femoral to above knee popliteal bypass graft with reversed right saphenous vein, right popliteal endarterectomy and patch angioplasty of the right popliteal artery with vein on 01/07/11 by Dr. Oneida Alar.   In March 2018, he underwent TURBT by Dr. Junious Silk for bladder cancer.  He was sent to radiology earlier this week for a lymph node biopsy of the left groin.  Instead of bx, he underwent Successful ultrasound needle aspiration of the left inguinal perigraft complex fluid collection compatible with abscess.  It was sent for cx & was found to have no growth aerobically or anaerobically.  He has been placed on Keflex, which was started yesterday.  He has not had any fever or chills.  He states he had a "two boils" in the left groin and one "popped" during the middle of the night so he could not describe what the fluid looked like at that time.  He also states that about a month prior, he a boil in the right groin that resolved.    He states he has had a couple of CT scans over the past couple of months.  Past Medical History:  Diagnosis Date  . Blood transfusion without reported diagnosis   . Carotid artery occlusion   . COPD (chronic obstructive pulmonary disease) ([place])   . Dizziness   . Foot pain 11/22/2010   while lying flat  . Hyperlipidemia   . Hypertension   . Leg pain    with walking  . Peripheral arterial disease [facility])     Past Surgical History:  Procedure Laterality Date  . BLADDER SURGERY    . COLONOSCOPY  May 2015  . IR GENERIC HISTORICAL  08/09/2016   IR NEPHROSTOMY PLACEMENT LEFT 08/09/2016 Jacqulynn Cadet, MD [facility]-INTERV RAD  . IR NEPHROSTOMY EXCHANGE LEFT  09/25/2016  . PR VEIN BYPASS GRAFT,AORTO-FEM-POP  01/07/11   (L)-(R) fem-fem; (R) fem-AK pop BP    . TRANSURETHRAL RESECTION OF BLADDER TUMOR N/A 08/27/2016   Procedure: TRANSURETHRAL RESECTION OF BLADDER TUMOR (TURBT) Greater than 5;  Surgeon: Festus Aloe, MD;  Location: [facility];  Service: Urology;  Laterality: N/A;    No Known Allergies  Current Outpatient Prescriptions  Medication Sig Dispense Refill  . aspirin EC 81 MG tablet Take 1 tablet (81 mg total) by mouth daily. 90 tablet 3  . atorvastatin (LIPITOR) 40 MG tablet Take 1 tablet (40 mg total) by mouth daily. 90 tablet 3  . hydrochlorothiazide (HYDRODIURIL) 50 MG tablet TAKE 1 TABLET BY MOUTH EVERY DAY 90 tablet 3  . losartan (COZAAR) 25 MG tablet Take 25 mg by mouth daily.    . metoprolol succinate (TOPROL-XL) 100 MG 24 hr tablet Take 1 tablet (100 mg total) by mouth daily. Take with or immediately following a meal. 90 tablet 3  . Multiple Vitamin (MULTIVITAMIN WITH MINERALS) TABS tablet Take 1 tablet by mouth daily.    . tamsulosin (FLOMAX) 0.4 MG CAPS capsule Take 1 capsule (0.4 mg total) by mouth daily. 30 capsule 11   Current Facility-Administered Medications  Medication Dose Route Frequency Provider Last Rate Last Dose  . 0.9 %  sodium chloride infusion  500 mL Intravenous Continuous Armbruster, Renelda Loma, MD  Family History  Problem Relation Age of Onset  . Stroke Mother   . Heart disease Father   . Breast cancer Sister   . Colon cancer Neg Hx     Social History   Social History  . Marital status: Divorced    Spouse name: N/A  . Number of children: N/A  . Years of education: N/A   Occupational History  . Not on file.   Social History Main Topics  . Smoking status: Former Smoker    Packs/day: 0.15    Years: 20.00    Types: Cigarettes    Start date: 03/30/2014    Quit date: 07/09/2016  . Smokeless tobacco: Never Used  . Alcohol use 7.2 - 8.4 oz/week    12 - 14 Standard drinks or equivalent per week     Comment: occas  . Drug use: No  . Sexual activity: Not Currently   Other Topics  Concern  . Not on file   Social History Narrative  . No narrative on file     REVIEW OF SYSTEMS:   [X]  denotes positive finding, [ ]  denotes negative finding Cardiac  Comments:  Chest pain or chest pressure:    Shortness of breath upon exertion:    Short of breath when lying flat:    Irregular heart rhythm:        Vascular    Pain in calf, thigh, or hip brought on by ambulation:    Pain in feet at night that wakes you up from your sleep:     Blood clot in your veins:    Leg swelling:         Pulmonary    Oxygen at home:    Productive cough:     Wheezing:         Neurologic    Sudden weakness in arms or legs:     Sudden numbness in arms or legs:     Sudden onset of difficulty speaking or slurred speech:    Temporary loss of vision in one eye:     Problems with dizziness:         Gastrointestinal    Blood in stool:     Vomited blood:         Genitourinary    Burning when urinating:     Blood in urine:        Psychiatric    Major depression:         Hematologic    Bleeding problems:    Problems with blood clotting too easily:        Skin    Rashes or ulcers: x See HPI      Constitutional    Fever or chills:  Denies     PHYSICAL EXAMINATION:  Vitals:   10/10/16 1432  BP: 135/80  Pulse: (!) 121  Resp: 16   Vitals:   10/10/16 1432  Weight: 116 lb (52.6 kg)  Height: 5\' 11"  (1.803 m)    General:  WDWN in NAD; vital signs documented above Gait: Not observed HENT: WNL, normocephalic Pulmonary: normal non-labored breathing  Skin: left groin with reddening of prior incision but no other erythema; there is no drainage.  There is a moderate side mass/fluid collection left groin.  Right groin is normal without any fluid or erythema.   Neurologic: A&O X 3;  No focal weakness or paresthesias are detected Psychiatric:  The pt has Normal affect.   Non-Invasive Vascular Imaging:   ABI's on 09/19/16:  Right:  0.80 Left:  0.54  Previous ABI's on  03/07/16: Right:  0.85 Left:  0.58  Arterial Duplex 09/19/16: patent right femoral to popliteal artery bypass graft, no hemodynamically significant stenosis. Elevated distal external iliac artery velocity of (240 cm/s) (was 275 cm/s on 03-07-16) No internal vessel narrowing noted within the bypass graft or anastomosis.  Hypoechoic area surrounding the proximal right to left fem-fem bypass graft.  CT Scan Abdomen/Pelvis 08/07/16: IMPRESSION: 1. Abnormal soft tissue within the urinary bladder lumen consistent with mass or clot or both. 2. Marked hydronephrosis and hydroureter bilaterally. 3. Extensive atherosclerotic calcification throughout the normal caliber aorta and its major branches. 4. Fluid surrounding the femoral-femoral graft. Fluid collections in the inguinal regions near the graft anastomoses, left larger than right. 5. Hiatal hernia 6. Diverticulosis  IR US aspiration left groin 10/04/16: Successful ultrasound needle aspiration of the left inguinal perigraft complex fluid collection compatible with abscess.  Left groin fluid cx 10/04/16: Specimen Description ABSCESS   Special Requests Normal   Gram Stain ABUNDANT WBC PRESENT,BOTH PMN AND MONONUCLEAR  NO ORGANISMS SEEN      Culture No growth aerobically or anaerobically.      Pt meds includes: Statin:  Yes.   Beta Blocker:  Yes.   Aspirin:  Yes.   ACEI:  No. ARB:  Yes.   CCB use:  No Other Antiplatelet/Anticoagulant:  No   ASSESSMENT/PLAN:: 61 y.o. male who is s/p left to right femoral to femoral bypass graft with 73mm Dacron and a right femoral to above knee popliteal bypass graft with reversed right saphenous vein, right popliteal endarterectomy and patch angioplasty of the right popliteal artery with vein on 01/07/11 by Dr. Oneida Alar who recently underwent TURBT for bladder cancer returns today for fluid collection and drainage from left groin.   -pt was sent to radiology for LN biopsy and underwent US aspiration of  left groin perigraft fluid.  There was no growth from this.  He was placed on Keflex yesterday.  Pt denies any fever or chills. -Dr. Oneida Alar examined pt and CT scan from 08/07/16.there is perigraft fluid seen.  Dr. Oneida Alar discussed with pt to continue antibiotic and return to see him in 2 weeks.  If he develops fever or chills or increased drainage, we will need to see him sooner.  He discussed with the pt that there is a possibility that this graft may need to be removed and if this is the case, then he would be at high risk for limb loss.  He will return to clinic sooner if he develops fever/chills or increased redness or drainage from the left groin.  If his graft does need to be removed, this would interfere with his cancer treatments.    Leontine Locket, PA-C [facility] [phone]  Clinic MD:  Oneida Alar

## 2016-10-16 ENCOUNTER — Other Ambulatory Visit: Payer: Self-pay | Admitting: Urology

## 2016-10-18 ENCOUNTER — Encounter: Payer: Self-pay | Admitting: Family

## 2016-10-24 ENCOUNTER — Encounter: Payer: Self-pay | Admitting: Vascular Surgery

## 2016-10-24 ENCOUNTER — Ambulatory Visit (INDEPENDENT_AMBULATORY_CARE_PROVIDER_SITE_OTHER): Payer: Medicare Other | Admitting: Vascular Surgery

## 2016-10-24 VITALS — BP 121/81 | HR 126 | Temp 96.5°F | Resp 16 | Ht 71.0 in | Wt 116.0 lb

## 2016-10-24 DIAGNOSIS — I739 Peripheral vascular disease, unspecified: Secondary | ICD-10-CM

## 2016-10-24 DIAGNOSIS — T827XXD Infection and inflammatory reaction due to other cardiac and vascular devices, implants and grafts, subsequent encounter: Secondary | ICD-10-CM

## 2016-10-24 MED ORDER — CEPHALEXIN 500 MG PO CAPS: 500.0000 mg | ORAL_CAPSULE | Freq: Two times a day (BID) | ORAL | 0 refills | Status: AC

## 2016-10-24 NOTE — Progress Notes (Signed)
Patient is a 61 year old male who returns today for further follow-up regarding possible femoral-femoral bypass graft infection. He denies any fever or chills. He has had no further drainage from his left groin. He was on Keflex for 2 weeks but has completed this course.  Of note the patient also has a large bladder cancer. He currently is refusing cystectomy. He has a nephrostomy tube in his left kidney. He is scheduled to undergo local resection of the tumor by his urologist on June 8. The patient understands that this will not be a curative operation and most likely will recur and potentially will recur within 2 years. However, he did not want to undergo cystectomy.  He currently has no claudication or rest pain symptoms in the legs.  Review of systems: He has had recent weight loss. He denies shortness of breath. He denies chest pain.  Physical exam:  Gen.: Thin ill-appearing white male  Vital signs: Vitals:   10/24/16 1003  BP: 121/81  Pulse: (!) 126  Resp: 16  Temp: (!) 96.5 F (35.8 C)  TempSrc: Oral  SpO2: 99%  Weight: 116 lb (52.6 kg)  Height: 5\' 11"  (1.803 m)    Extremities: Palpable femorofemoral graft pulse raised area with 4 uncle left groin no active drainage no significant erythema over the remainder of the graft both feet pink warm well perfused  Assessment: Patient most likely has infection of his femoral-femoral bypass graft but has fairly low level symptoms currently. I discussed with the patient today chronic antibiotic suppression in light of the fact that he is not going to undergo curative resection of his bladder tumor. I also discussed with him the downside of this would be that he potentially could get septic from the graft infection or have significant hemorrhage from the graft infection. However, he is adamant that he would not want to undergo a right leg amputation. He thinks this would significantly diminish his quality of life. I did discuss with him that if  we remove the femoral-femoral bypass he would be at high risk for limb loss. Currently he has opted for antibiotic suppression.  Plan: The patient will follow-up with me in a few weeks after his bladder operation. I have prescribed for him Keflex 500 mg twice a day with a one-month supply today. Plan currently will be for indefinite antibiotic suppression. If the patient changes his mind we would need to consider graft removal. Prior to his next office visit with me he will undergo CT angiogram of the abdomen and pelvis with lower extremity runoff for further assessment and possible planning of graft removal versus conservative management.  Ruta Hinds, MD [facility] [place] Office: [phone] Pager: [phone]

## 2016-10-30 ENCOUNTER — Encounter (HOSPITAL_BASED_OUTPATIENT_CLINIC_OR_DEPARTMENT_OTHER): Payer: Self-pay | Admitting: *Deleted

## 2016-10-30 NOTE — Progress Notes (Signed)
To [facility] at 0800- [place] on arrival-Ekg-with chart-Instructed Npo after Mn-may take lipitor with small amt water.Son will pick him up post op-has a roommate to stay with him at home.

## 2016-11-07 NOTE — H&P (Signed)
Office Visit Report 10/14/2016    Eric Padilla         MRN: [id number]  PRIMARY CARE:    DOB: 06-09-1955, 61 year old Male  REFERRING:  Georgette Dover, MD  SSN: [id number]  PROVIDER:  Festus Aloe, M.D.    LOCATION:  [facility] [phone]    CC: I have bladder cancer that has been treated.  HPI: Eric Padilla is a 61 year-old male established patient who is here for follow-up of bladder cancer treatment.  He did have a TURBT. His last bladder tumor was resected 08/27/2016. He has had the following number of bladder resections: 1.   His last radiologic test to evaluate the kidneys was 08/07/2016.   CT and CXR done Aug 07, 2016. Patient underwent TURBT August 27, 2016. The bladder was filled with a 8.5 x 9 cm necrotic mass. 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 Reviewing his CT, the radiologist thought the inguinal fluid collections might be necrotic nodes. Indeed, pt noted these well up in the left groin over past few months. They were not present after the fem-fem and therefore may not be related.   He underwent aspiration of the inguinal fluid and it was c/w peri-graft infection. Cx was negative. He followed up with Vascular and may need the graft removed. He feels like the collection has filled back up again. He's been voiding OK. Stream is slow and intermittent. Urine is clear. He said he may "lose his leg".     CC: I have hydronephrosis.  HPI: His problem was diagnosed 08/07/2016. The problem is on both sides. He had the following x-rays done: Renal Ultrasound and CT Scan.   He is not currently having flank pain, back pain, groin pain, nausea, vomiting, fever or chills.   He is status post a left nephrostomy tube August 09, 2016. Nx tube changed Sep 25, 2016.      ALLERGIES: None   MEDICATIONS: Keflex 500 mg capsule 1 capsule PO BID  Lipitor  Metoprolol Tartrate  Flomax 0.4 mg capsule, ext release 24 hr     GU PSH: Cystoscopy - 08/23/2016 Cystoscopy TURBT  >5 cm - 08/27/2016 Locm 300-399Mg /Ml Iodine,1Ml - 08/26/2016    NON-GU PSH: None       GU PMH: Bladder Cancer Lateral - 09/18/2016 Hydronephrosis Unspec - 09/18/2016, - 08/23/2016 Bladder tumor/neoplasm - 08/23/2016    NON-GU PMH: Hypertension    FAMILY HISTORY: None   SOCIAL HISTORY: Marital Status: Divorced Drinks 2 drinks per day.  Drinks 2 caffeinated drinks per day.     Notes: 1 son    REVIEW OF SYSTEMS:    GU Review Male:  Patient reports stream starts and stops, burning/ pain with urination, leakage of urine, get up at night to urinate, frequent urination, and hard to postpone urination. Patient denies erection problems, penile pain, trouble starting your stream, and have to strain to urinate .   Gastrointestinal (Upper):  Patient denies nausea, vomiting, and indigestion/ heartburn.   Gastrointestinal (Lower):  Patient denies diarrhea and constipation.   Constitutional:  Patient denies fever, night sweats, weight loss, and fatigue.   Skin:  Patient denies skin rash/ lesion and itching.   Eyes:  Patient denies blurred vision and double vision.   Ears/ Nose/ Throat:  Patient denies sore throat and sinus problems.   Hematologic/Lymphatic:  Patient denies swollen glands and easy bruising.   Cardiovascular:  Patient denies leg swelling and chest pains.   Respiratory:  Patient denies cough and shortness of breath.   Endocrine:  Patient denies excessive thirst.   Musculoskeletal:  Patient denies back pain and joint pain.   Neurological:  Patient denies headaches and dizziness.   Psychologic:  Patient denies depression and anxiety.   VITAL SIGNS:      10/14/2016 03:18 PM    Weight 116 lb / 52.62 kg    BP 126/78 mmHg    Heart Rate 128 /min    Temperature 97.7 F / 36 C    GU PHYSICAL EXAMINATION:     Anus and Perineum: No hemorrhoids. No anal stenosis. No rectal fissure, no anal fissure. No edema, no dimple, no perineal tenderness, no anal tenderness.    Scrotum: No lesions. No edema.  No cysts. No warts.    Epididymides: Right: no spermatocele, no masses, no cysts, no tenderness, no induration, no enlargement. Left: no spermatocele, no masses, no cysts, no tenderness, no induration, no enlargement.    Testes: No tenderness, no swelling, no enlargement left testes. No tenderness, no swelling, no enlargement right testes. Normal location left testes. Normal location right testes. No mass, no cyst, no varicocele, no hydrocele left testes. No mass, no cyst, no varicocele, no hydrocele right testes.    Urethral Meatus: Normal size. No lesion, no wart, no discharge, no polyp. Normal location.    Penis: Circumcised, no warts, no cracks. No dorsal Peyronie's plaques, no left corporal Peyronie's plaques, no right corporal Peyronie's plaques, no scarring, no warts. No balanitis, no meatal stenosis.    Prostate: 40 gram or 2+ size. Left lobe normal consistency, right lobe normal consistency. Symmetrical lobes. No prostate nodule. Left lobe no tenderness, right lobe no tenderness.    Seminal Vesicles: Nonpalpable.    Sphincter Tone: Normal sphincter. No rectal tenderness. No rectal mass.     MULTI-SYSTEM PHYSICAL EXAMINATION:     Constitutional: Well-nourished. No physical deformities. Normally developed. Good grooming.    Neck: Neck symmetrical, not swollen. Normal tracheal position.    Respiratory: No labored breathing, no use of accessory muscles.     Cardiovascular: Normal temperature, normal extremity pulses, no swelling, no varicosities.    Skin: No paleness, no jaundice, no cyanosis. No lesion, no ulcer, no rash.    Neurologic / Psychiatric: Oriented to time, oriented to place, oriented to person. No depression, no anxiety, no agitation.          PAST DATA REVIEWED:   Source Of History:  Patient   PROCEDURES:    Urinalysis w/Scope  Dipstick Dipstick Cont'd Micro  Color: Amber Bilirubin: Neg WBC/hpf: >60/hpf  Appearance: Cloudy Ketones: Neg RBC/hpf: 20 - 40/hpf  Specific  Gravity: 1.020 Blood: 3+ Bacteria: Mod (26-50/hpf)  pH: 6.0 Protein: 3+ Cystals: NS (Not Seen)  Glucose: Neg Urobilinogen: 2.0 Casts: NS (Not Seen)   Nitrites: Neg Trichomonas: Not Present   Leukocyte Esterase: 3+ Mucous: Present    Epithelial Cells: 0 - 5/hpf    Yeast: NS (Not Seen)    Sperm: Present   Notes: UNCONCENTRATED MICROSCOPIC     ASSESSMENT:     ICD-10 Details  1 GU:  Hydronephrosis Unspec - N13.30   2  Bladder Cancer Lateral - C67.2    PLAN:   Orders  Labs Urine Culture   Lab Notes:   Schedule  Return Visit/Planned Activity: Next Available Appointment - Schedule Surgery  Document  Letter(s):  Created for Patient: Clinical Summary   Notes:  bladder ca -  Pt declined cystectomy.   hydro -  if we can find the left UO, I'll try and put a stent in place. We discussed it could fail, but he really wants the Nx tube out.   cc: Dr. Oneida Alar    ** Signed by Festus Aloe, M.D. on 10/15/16 at 9:21 AM (EDT)*  The information contained in this medical record document is considered private and confidential patient information. This information can only be used for the medical diagnosis and/or medical services that are being provided by the patient's selected caregivers. This information can only be distributed outside of the patient's care if the patient agrees and signs waivers of authorization for this information to be sent to an outside source or route.  ADD: Urine cx + : I sent NF to begin 6/5.

## 2016-11-08 ENCOUNTER — Encounter (HOSPITAL_BASED_OUTPATIENT_CLINIC_OR_DEPARTMENT_OTHER): Payer: Self-pay | Admitting: *Deleted

## 2016-11-08 ENCOUNTER — Encounter (HOSPITAL_BASED_OUTPATIENT_CLINIC_OR_DEPARTMENT_OTHER)
Admission: RE | Disposition: A | Discharge status: Home / Self Care | Payer: Self-pay | Source: Ambulatory Visit | Attending: Urology

## 2016-11-08 ENCOUNTER — Ambulatory Visit (HOSPITAL_BASED_OUTPATIENT_CLINIC_OR_DEPARTMENT_OTHER): Payer: Medicare Other | Admitting: Anesthesiology

## 2016-11-08 ENCOUNTER — Ambulatory Visit (HOSPITAL_BASED_OUTPATIENT_CLINIC_OR_DEPARTMENT_OTHER)
Admission: RE | Admit: 2016-11-08 | Discharge: 2016-11-08 | Disposition: A | Discharge status: Home / Self Care | Payer: Medicare Other | Source: Ambulatory Visit | Attending: Urology | Admitting: Urology

## 2016-11-08 DIAGNOSIS — R000 Tachycardia, unspecified: Secondary | ICD-10-CM | POA: Diagnosis not present

## 2016-11-08 DIAGNOSIS — N1330 Unspecified hydronephrosis: Secondary | ICD-10-CM | POA: Insufficient documentation

## 2016-11-08 DIAGNOSIS — Z87891 Personal history of nicotine dependence: Secondary | ICD-10-CM | POA: Insufficient documentation

## 2016-11-08 DIAGNOSIS — I10 Essential (primary) hypertension: Secondary | ICD-10-CM | POA: Diagnosis not present

## 2016-11-08 DIAGNOSIS — C672 Malignant neoplasm of lateral wall of bladder: Secondary | ICD-10-CM

## 2016-11-08 HISTORY — DX: Malignant (primary) neoplasm, unspecified: C80.1

## 2016-11-08 HISTORY — PX: CYSTOSCOPY W/ RETROGRADES: SHX1426

## 2016-11-08 HISTORY — PX: TRANSURETHRAL RESECTION OF BLADDER TUMOR: SHX2575

## 2016-11-08 HISTORY — DX: Unspecified open wound of abdominal wall, unspecified quadrant without penetration into peritoneal cavity, initial encounter: S31.109A

## 2016-11-08 LAB — POCT I-STAT 4, (NA,K, GLUC, HGB,HCT)
Glucose, Bld: 107 mg/dL — ABNORMAL HIGH (ref 65–99)
HCT: 37 % — ABNORMAL LOW (ref 39.0–52.0)
Hemoglobin: 12.6 g/dL — ABNORMAL LOW (ref 13.0–17.0)
Potassium: 3 mmol/L — ABNORMAL LOW (ref 3.5–5.1)
Sodium: 136 mmol/L (ref 135–145)

## 2016-11-08 SURGERY — TURBT (TRANSURETHRAL RESECTION OF BLADDER TUMOR)
Anesthesia: General | Site: Ureter

## 2016-11-08 MED ORDER — FENTANYL CITRATE (PF) 100 MCG/2ML IJ SOLN
INTRAMUSCULAR | Status: AC
  Filled 2016-11-08: qty 2

## 2016-11-08 MED ORDER — ONDANSETRON HCL 4 MG/2ML IJ SOLN
INTRAMUSCULAR | Status: DC | PRN
Start: 2016-11-08 — End: 2016-11-08
  Administered 2016-11-08: 4 mg via INTRAVENOUS

## 2016-11-08 MED ORDER — FENTANYL CITRATE (PF) 100 MCG/2ML IJ SOLN
25.0000 ug | INTRAMUSCULAR | Status: DC | PRN
  Administered 2016-11-08 (×3): 50 ug via INTRAVENOUS
  Filled 2016-11-08: qty 1

## 2016-11-08 MED ORDER — LINEZOLID 600 MG/300ML IV SOLN
600.0000 mg | Freq: Once | INTRAVENOUS | Status: AC
  Administered 2016-11-08: 600 mg via INTRAVENOUS
  Filled 2016-11-08 (×2): qty 300

## 2016-11-08 MED ORDER — PROPOFOL 10 MG/ML IV BOLUS
INTRAVENOUS | Status: DC | PRN
  Administered 2016-11-08: 20 mg via INTRAVENOUS
  Administered 2016-11-08: 120 mg via INTRAVENOUS

## 2016-11-08 MED ORDER — FENTANYL CITRATE (PF) 100 MCG/2ML IJ SOLN
INTRAMUSCULAR | Status: DC | PRN
Start: 2016-11-08 — End: 2016-11-08
  Administered 2016-11-08: 50 ug via INTRAVENOUS
  Administered 2016-11-08: 25 ug via INTRAVENOUS
  Administered 2016-11-08 (×2): 50 ug via INTRAVENOUS
  Administered 2016-11-08: 25 ug via INTRAVENOUS

## 2016-11-08 MED ORDER — CEFAZOLIN SODIUM-DEXTROSE 2-4 GM/100ML-% IV SOLN
2.0000 g | INTRAVENOUS | Status: AC
  Administered 2016-11-08: 2 g via INTRAVENOUS
  Filled 2016-11-08: qty 100

## 2016-11-08 MED ORDER — CEFAZOLIN SODIUM-DEXTROSE 2-4 GM/100ML-% IV SOLN
INTRAVENOUS | Status: AC
  Filled 2016-11-08: qty 100

## 2016-11-08 MED ORDER — LIDOCAINE 2% (20 MG/ML) 5 ML SYRINGE
INTRAMUSCULAR | Status: DC | PRN
  Administered 2016-11-08: 60 mg via INTRAVENOUS

## 2016-11-08 MED ORDER — PROPOFOL 10 MG/ML IV BOLUS
INTRAVENOUS | Status: AC
  Filled 2016-11-08: qty 20

## 2016-11-08 MED ORDER — SODIUM CHLORIDE 0.9 % IR SOLN
Status: DC | PRN
  Administered 2016-11-08: 3000 mL
  Administered 2016-11-08: 6000 mL
  Administered 2016-11-08 (×5): 3000 mL

## 2016-11-08 MED ORDER — MIDAZOLAM HCL 2 MG/2ML IJ SOLN
INTRAMUSCULAR | Status: AC
  Filled 2016-11-08: qty 2

## 2016-11-08 MED ORDER — LIDOCAINE 2% (20 MG/ML) 5 ML SYRINGE
INTRAMUSCULAR | Status: AC
  Filled 2016-11-08: qty 5

## 2016-11-08 MED ORDER — DEXAMETHASONE SODIUM PHOSPHATE 10 MG/ML IJ SOLN
INTRAMUSCULAR | Status: DC | PRN
Start: 2016-11-08 — End: 2016-11-08
  Administered 2016-11-08: 10 mg via INTRAVENOUS

## 2016-11-08 MED ORDER — DEXAMETHASONE SODIUM PHOSPHATE 10 MG/ML IJ SOLN
INTRAMUSCULAR | Status: AC
  Filled 2016-11-08: qty 1

## 2016-11-08 MED ORDER — OXYCODONE HCL 5 MG PO TABS
5.0000 mg | ORAL_TABLET | Freq: Once | ORAL | Status: DC | PRN
  Filled 2016-11-08: qty 1

## 2016-11-08 MED ORDER — ONDANSETRON HCL 4 MG/2ML IJ SOLN
INTRAMUSCULAR | Status: AC
  Filled 2016-11-08: qty 2

## 2016-11-08 MED ORDER — IOHEXOL 300 MG/ML  SOLN
INTRAMUSCULAR | Status: DC | PRN
  Administered 2016-11-08: 10 mL

## 2016-11-08 MED ORDER — BELLADONNA ALKALOIDS-OPIUM 16.2-60 MG RE SUPP
RECTAL | Status: AC
  Filled 2016-11-08: qty 1

## 2016-11-08 MED ORDER — MIDAZOLAM HCL 5 MG/5ML IJ SOLN
INTRAMUSCULAR | Status: DC | PRN
  Administered 2016-11-08: 1 mg via INTRAVENOUS

## 2016-11-08 MED ORDER — LACTATED RINGERS IV SOLN
INTRAVENOUS | Status: DC
  Administered 2016-11-08 (×2): via INTRAVENOUS
  Filled 2016-11-08: qty 1000

## 2016-11-08 MED ORDER — ONDANSETRON HCL 4 MG/2ML IJ SOLN
4.0000 mg | Freq: Once | INTRAMUSCULAR | Status: DC | PRN
  Filled 2016-11-08: qty 2

## 2016-11-08 MED ORDER — EPHEDRINE 5 MG/ML INJ
INTRAVENOUS | Status: AC
  Filled 2016-11-08: qty 10

## 2016-11-08 MED ORDER — OXYCODONE HCL 5 MG/5ML PO SOLN
5.0000 mg | Freq: Once | ORAL | Status: DC | PRN
  Filled 2016-11-08: qty 5

## 2016-11-08 SURGICAL SUPPLY — 47 items
BAG DRAIN URO-CYSTO SKYTR STRL (DRAIN) ×4 IMPLANT
BAG URINE DRAINAGE (UROLOGICAL SUPPLIES) ×4 IMPLANT
BAG URINE LEG 19OZ MD ST LTX (BAG) IMPLANT
BASKET LASER NITINOL 1.9FR (BASKET) IMPLANT
BASKET STNLS GEMINI 4WIRE 3FR (BASKET) IMPLANT
BASKET ZERO TIP NITINOL 2.4FR (BASKET) IMPLANT
CATH FOLEY 2WAY SLVR  5CC 20FR (CATHETERS)
CATH FOLEY 2WAY SLVR  5CC 22FR (CATHETERS)
CATH FOLEY 2WAY SLVR 5CC 20FR (CATHETERS) IMPLANT
CATH FOLEY 2WAY SLVR 5CC 22FR (CATHETERS) IMPLANT
CATH FOLEY 3WAY 30CC 22F (CATHETERS) ×4 IMPLANT
CATH INTERMIT  6FR 70CM (CATHETERS) IMPLANT
CATH URET 5FR 28IN CONE TIP (BALLOONS)
CATH URET 5FR 28IN OPEN ENDED (CATHETERS) IMPLANT
CATH URET 5FR 70CM CONE TIP (BALLOONS) IMPLANT
CATH URET DUAL LUMEN 6-10FR 50 (CATHETERS) IMPLANT
CLOTH BEACON ORANGE TIMEOUT ST (SAFETY) ×4 IMPLANT
DRSG TELFA 3X8 NADH (GAUZE/BANDAGES/DRESSINGS) IMPLANT
ELECT BUTTON BIOP 24F 90D PLAS (MISCELLANEOUS) IMPLANT
ELECT REM PT RETURN 9FT ADLT (ELECTROSURGICAL) ×4
ELECTRODE REM PT RTRN 9FT ADLT (ELECTROSURGICAL) ×2 IMPLANT
EVACUATOR MICROVAS BLADDER (UROLOGICAL SUPPLIES) IMPLANT
FIBER LASER FLEXIVA 365 (UROLOGICAL SUPPLIES) IMPLANT
FIBER LASER TRAC TIP (UROLOGICAL SUPPLIES) IMPLANT
GLOVE BIO SURGEON STRL SZ7.5 (GLOVE) ×8 IMPLANT
GOWN STRL REUS W/ TWL XL LVL3 (GOWN DISPOSABLE) ×4 IMPLANT
GOWN STRL REUS W/TWL XL LVL3 (GOWN DISPOSABLE) ×4
GRAVITY DRAINAGE BAG ×4 IMPLANT
GUIDEWIRE 0.038 PTFE COATED (WIRE) IMPLANT
GUIDEWIRE ANG ZIPWIRE 038X150 (WIRE) IMPLANT
GUIDEWIRE STR DUAL SENSOR (WIRE) ×4 IMPLANT
HOLDER FOLEY CATH W/STRAP (MISCELLANEOUS) ×4 IMPLANT
IV NS IRRIG 3000ML ARTHROMATIC (IV SOLUTION) ×32 IMPLANT
KIT BALLIN UROMAX 15FX10 (LABEL) IMPLANT
KIT BALLN UROMAX 15FX4 (MISCELLANEOUS) IMPLANT
KIT BALLN UROMAX 26 75X4 (MISCELLANEOUS)
KIT RM TURNOVER CYSTO AR (KITS) ×4 IMPLANT
LOOP CUT BIPOLAR 24F LRG (ELECTROSURGICAL) ×8 IMPLANT
MANIFOLD NEPTUNE II (INSTRUMENTS) ×4 IMPLANT
PACK CYSTO (CUSTOM PROCEDURE TRAY) ×4 IMPLANT
PLUG CATH AND CAP STER (CATHETERS) IMPLANT
SET ASPIRATION TUBING (TUBING) IMPLANT
SET HIGH PRES BAL DIL (LABEL)
SYR 30ML LL (SYRINGE) ×4 IMPLANT
SYRINGE IRR TOOMEY STRL 70CC (SYRINGE) ×4 IMPLANT
TUBE CONNECTING 12'X1/4 (SUCTIONS) ×1
TUBE CONNECTING 12X1/4 (SUCTIONS) ×3 IMPLANT

## 2016-11-08 NOTE — Discharge Instructions (Signed)
Post Anesthesia Home Care Instructions  Activity: Get plenty of rest for the remainder of the day. A responsible individual must stay with you for 24 hours following the procedure.  For the next 24 hours, DO NOT: -Drive a car -Paediatric nurse -Drink alcoholic beverages -Take any medication unless instructed by your physician -Make any legal decisions or sign important papers.  Meals: Start with liquid foods such as gelatin or soup. Progress to regular foods as tolerated. Avoid greasy, spicy, heavy foods. If nausea and/or vomiting occur, drink only clear liquids until the nausea and/or vomiting subsides. Call your physician if vomiting continues.  Special Instructions/Symptoms: Your throat may feel dry or sore from the anesthesia or the breathing tube placed in your throat during surgery. If this causes discomfort, gargle with warm salt water. The discomfort should disappear within 24 hours.  If you had a scopolamine patch placed behind your ear for the management of post- operative nausea and/or vomiting:  1. The medication in the patch is effective for 72 hours, after which it should be removed.  Wrap patch in a tissue and discard in the trash. Wash hands thoroughly with soap and water. 2. You may remove the patch earlier than 72 hours if you experience unpleasant side effects which may include dry mouth, dizziness or visual disturbances. 3. Avoid touching the patch. Wash your hands with soap and water after contact with the patch.   Indwelling Urinary Catheter Care, Adult Take good care of your catheter to keep it working and to prevent problems.  REMOVAL OF FOLEY -- remove the foley Monday morning, June 11 as instructed   How to wear your catheter Attach your catheter to your leg with tape (adhesive tape) or a leg strap. Make sure it is not too tight. If you use tape, remove any bits of tape that are already on the catheter. How to wear a drainage bag You should have:  A large  overnight bag.  A small leg bag.  Overnight Bag You may wear the overnight bag at any time. Always keep the bag below the level of your bladder but off the floor. When you sleep, put a clean plastic bag in a wastebasket. Then hang the bag inside the wastebasket. Leg Bag Never wear the leg bag at night. Always wear the leg bag below your knee. Keep the leg bag secure with a leg strap or tape. How to care for your skin  Clean the skin around the catheter at least once every day.  Shower every day. Do not take baths.  Put creams, lotions, or ointments on your genital area only as told by your doctor.  Do not use powders, sprays, or lotions on your genital area. How to clean your catheter and your skin 1. Wash your hands with soap and water. 2. Wet a washcloth in warm water and gentle (mild) soap. 3. Use the washcloth to clean the skin where the catheter enters your body. Clean downward and wipe away from the catheter in small circles. Do not wipe toward the catheter. 4. Pat the area dry with a clean towel. Make sure to clean off all soap. How to care for your drainage bags Empty your drainage bag when it is ?- full or at least 2-3 times a day. Replace your drainage bag once a month or sooner if it starts to smell bad or look dirty. Do not clean your drainage bag unless told by your doctor. Emptying a drainage bag  Supplies Needed  Rubbing alcohol.  Gauze pad or cotton ball.  Tape or a leg strap.  Steps 1. Wash your hands with soap and water. 2. Separate (detach) the bag from your leg. 3. Hold the bag over the toilet or a clean container. Keep the bag below your hips and bladder. This stops pee (urine) from going back into the tube. 4. Open the pour spout at the bottom of the bag. 5. Empty the pee into the toilet or container. Do not let the pour spout touch any surface. 6. Put rubbing alcohol on a gauze pad or cotton ball. 7. Use the gauze pad or cotton ball to clean the pour  spout. 8. Close the pour spout. 9. Attach the bag to your leg with tape or a leg strap. 10. Wash your hands.  Changing a drainage bag Supplies Needed  Alcohol wipes.  A clean drainage bag.  Adhesive tape or a leg strap.  Steps 1. Wash your hands with soap and water. 2. Separate the dirty bag from your leg. 3. Pinch the rubber catheter with your fingers so that pee does not spill out. 4. Separate the catheter tube from the drainage tube where these tubes connect (at the connection valve). Do not let the tubes touch any surface. 5. Clean the end of the catheter tube with an alcohol wipe. Use a different alcohol wipe to clean the end of the drainage tube. 6. Connect the catheter tube to the drainage tube of the clean bag. 7. Attach the new bag to the leg with adhesive tape or a leg strap. 8. Wash your hands.  How to prevent infection and other problems  Never pull on your catheter or try to remove it. Pulling can damage tissue in your body.  Always wash your hands before and after touching your catheter.  If a leg strap gets wet, replace it with a dry one.  Drink enough fluids to keep your pee clear or pale yellow, or as told by your doctor.  Do not let the drainage bag or tubing touch the floor.  Wear cotton underwear.  If you are male, wipe from front to back after you poop (have a bowel movement).  Check on the catheter often to make sure it works and the tubing is not twisted. Get help if:  Your pee is cloudy.  Your pee smells unusually bad.  Your pee is not draining into the bag.  Your tube gets clogged.  Your catheter starts to leak.  Your bladder feels full. Get help right away if:  You have redness, swelling, or pain where the catheter enters your body.  You have fluid, pus, or a bad smell coming from the area where the catheter enters your body.  The area where the catheter enters your body feels warm.  You have a fever.  You have pain in  your: ? Stomach (abdomen). ? Legs. ? Lower back. ? Bladder.  You see blood fill the catheter.  Your pee is pink or red.  You feel sick to your stomach (nauseous).  You throw up (vomit).  You have chills.  Your catheter gets pulled out. This information is not intended to replace advice given to you by your health care provider. Make sure you discuss any questions you have with your health care provider. Document Released: 09/14/2012 Document Revised: 04/17/2016 Document Reviewed: 11/02/2013 Elsevier Interactive Patient Education  [employer].

## 2016-11-08 NOTE — Progress Notes (Signed)
  Urine clear in PACU with CBI off. Will d/c home with foley over weekend. Discussed with patient, son and family progression and terminal nature. Discussed referal to oncology for eval - chemo/XRT which I doubt he is a candidate and/or hospice.

## 2016-11-08 NOTE — Interval H&P Note (Signed)
History and Physical Interval Note:  11/08/2016 9:33 AM  Eric Padilla  has presented today for surgery, with the diagnosis of bladder cancer left hydronephrosis  The various methods of treatment have been discussed with the patient and family. After consideration of risks, benefits and other options for treatment, the patient has consented to  Procedure(s): TRANSURETHRAL RESECTION OF BLADDER TUMOR (TURBT) (N/A) CYSTOSCOPY WITH RETROGRADE STENT PLACEMENT (Left) as a surgical intervention .  The patient's history has been reviewed, patient examined, no change in status, stable for surgery. He did start abx. No fever or dysuria. He is having more hesitancy, straining to urinate, urgency and UUI over past few weeks. Left Nx tube draining well. I have reviewed the patient's chart and labs.  Questions were answered to the patient's satisfaction.     Marilyn Wing

## 2016-11-08 NOTE — Anesthesia Postprocedure Evaluation (Signed)
Anesthesia Post Note  Patient: Eric Padilla  Procedure(s) Performed: Procedure(s) (LRB): TRANSURETHRAL RESECTION OF BLADDER TUMOR (TURBT) (N/A) CYSTOSCOPY WITH RETROGRADE STENT PLACEMENT (Left)     Patient location during evaluation: PACU Anesthesia Type: General Level of consciousness: awake, awake and alert and oriented Pain management: pain level controlled Vital Signs Assessment: post-procedure vital signs reviewed and stable Respiratory status: spontaneous breathing, nonlabored ventilation and respiratory function stable Cardiovascular status: blood pressure returned to baseline Anesthetic complications: no    Last Vitals:  Vitals:   11/08/16 1315 11/08/16 1330  BP: (!) 167/83 (!) 161/80  Pulse: 98 (!) 102  Resp: 16 20  Temp:      Last Pain:  Vitals:   11/08/16 1315  TempSrc:   PainSc: Asleep                 Leora Platt COKER

## 2016-11-08 NOTE — Transfer of Care (Signed)
Immediate Anesthesia Transfer of Care Note  Patient: Eric Padilla  Procedure(s) Performed: Procedure(s): TRANSURETHRAL RESECTION OF BLADDER TUMOR (TURBT) (N/A) CYSTOSCOPY WITH RETROGRADE STENT PLACEMENT (Left)  Patient Location: PACU  Anesthesia Type:General  Level of Consciousness: sedated and responds to stimulation  Airway & Oxygen Therapy: Patient Spontanous Breathing and Patient connected to nasal cannula oxygen  Post-op Assessment: Report given to RN  Post vital signs: Reviewed and stable, same as baseline  Last Vitals: 165/91, 106, 20, 100%, 97.7 Vitals:   11/08/16 0758  BP: 138/71  Pulse: (!) 133  Resp: 18  Temp: 36.3 C    Last Pain:  Vitals:   11/08/16 0758  TempSrc: Oral      Patients Stated Pain Goal: 5 (97/41/63 [id number])  Complications: No apparent anesthesia complications

## 2016-11-08 NOTE — Op Note (Signed)
Preoperative diagnosis bladder cancer, left lateral Postoperative diagnosis: Same  Procedure: TURBT greater than 5 cm  Surgeon: Junious Silk  Anesthesia: Gen.  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 He declined cystectomy. He was brought back for a palliative resection. He's been having more trouble voiding.  Findings: On exam under anesthesia the penis is circumcised and without mass or lesion. The testicles were descended bilaterally and palpably normal. On digital rectal exam the prostate is palpably enlarged but feels contiguous with the bladder and on bimanual exam on this feels fixed consistent with T4 disease. The fluid collection around the left inguinal portion of the bypass graft remains fluctuant but there is no erythema and no drainage. It does look better than in the past. The patient is otherwise unkept and cachectic.  On cystoscopy there is again a large necrotic tumor emanating from the left bladder wall. I could not uncover the left trigone. The cancer had grown and filled the bladder neck and progressed around the bladder neck to about the 3:00 position.  Description of procedure: After consent was obtained the patient was brought to the operating room. After adequate anesthesia he was placed in lithotomy position and prepped and draped in the usual sterile fashion. The cystoscope was passed per urethra and I was not able to see a lot as bladder tumor and filled the bladder neck and prostatic urethra. The resectoscope sheath and the loop was passed and using the large loop I started at about 12:00 and was able to come down and resected all the tumor back to the bladder neck. I was then able to go in resect more tumor from 12-6 further up into the bladder. I was able to resect along the bladder neck to the 3:00 position. Finally I was able to uncover the right trigone and progress this resection over to the left.  After a couple of hours I tried to probe and find the left ureteral orifice and could not and thought it was best to stop at this point. I believe I have created enough space for him to void and hopefully unobstructed the bladder neck. The scope was removed. There was adequate hemostasis. A 22 French three-way catheter was advanced and I started CBI for wake up. Irrigated to clear quickly and the left to gravity drainage. He was awakened taken to the recovery room in stable condition.  Complications: None  Blood loss: 50 mL  Specimens: Bladder tumor chips to pathology  Drains: 22 French three-way catheter  Disposition: Patient stable to PACU

## 2016-11-08 NOTE — Anesthesia Procedure Notes (Signed)
Procedure Name: LMA Insertion Date/Time: 11/08/2016 10:06 AM Performed by: Bethena Roys T Pre-anesthesia Checklist: Patient identified, Emergency Drugs available, Suction available and Patient being monitored Patient Re-evaluated:Patient Re-evaluated prior to inductionOxygen Delivery Method: Circle system utilized Preoxygenation: Pre-oxygenation with 100% oxygen Intubation Type: IV induction Ventilation: Mask ventilation without difficulty LMA: LMA inserted LMA Size: 4.0 Number of attempts: 1 Airway Equipment and Method: Bite block Placement Confirmation: positive ETCO2 Tube secured with: Tape Dental Injury: Teeth and Oropharynx as per pre-operative assessment

## 2016-11-08 NOTE — Anesthesia Preprocedure Evaluation (Signed)
Anesthesia Evaluation  Patient identified by MRN, date of birth, ID band Patient awake    Reviewed: Allergy & Precautions, NPO status , Patient's Chart, lab work & pertinent test results  Airway Mallampati: II  TM Distance: >3 FB     Dental  (+) Poor Dentition, Dental Advisory Given   Pulmonary former smoker,     + decreased breath sounds      Cardiovascular hypertension,  Rhythm:Regular Rate:Tachycardia     Neuro/Psych    GI/Hepatic   Endo/Other    Renal/GU      Musculoskeletal   Abdominal (+) + scaphoid   Peds  Hematology   Anesthesia Other Findings   Reproductive/Obstetrics                             Anesthesia Physical Anesthesia Plan  ASA: III  Anesthesia Plan: General   Post-op Pain Management:    Induction: Intravenous  PONV Risk Score and Plan: Ondansetron  Airway Management Planned: LMA  Additional Equipment:   Intra-op Plan:   Post-operative Plan:   Informed Consent: I have reviewed the patients History and Physical, chart, labs and discussed the procedure including the risks, benefits and alternatives for the proposed anesthesia with the patient or authorized representative who has indicated his/her understanding and acceptance.   Dental advisory given  Plan Discussed with: CRNA and Anesthesiologist  Anesthesia Plan Comments:         Anesthesia Quick Evaluation

## 2016-11-11 ENCOUNTER — Encounter (HOSPITAL_BASED_OUTPATIENT_CLINIC_OR_DEPARTMENT_OTHER): Payer: Self-pay | Admitting: Urology

## 2016-11-14 ENCOUNTER — Encounter: Payer: Self-pay | Admitting: Oncology

## 2016-11-14 ENCOUNTER — Telehealth: Payer: Self-pay | Admitting: Oncology

## 2016-11-14 NOTE — Telephone Encounter (Signed)
Pt returned my call to schedule an appt. Appt has been scheduled for the pt to see Dr. Alen Blew on 7/3 at 2pm. Pt aware to arrive 30 minutes early. Demographics verified. Letter mailed.

## 2016-11-15 ENCOUNTER — Encounter: Payer: Self-pay | Admitting: Vascular Surgery

## 2016-11-18 ENCOUNTER — Inpatient Hospital Stay: Admission: RE | Admit: 2016-11-18 | Payer: Medicare Other | Source: Ambulatory Visit

## 2016-11-20 ENCOUNTER — Inpatient Hospital Stay (HOSPITAL_COMMUNITY): Admission: RE | Admit: 2016-11-20 | Payer: Medicare Other | Source: Ambulatory Visit

## 2016-11-27 ENCOUNTER — Telehealth: Payer: Self-pay | Admitting: Vascular Surgery

## 2016-11-27 NOTE — Telephone Encounter (Signed)
Patient's roommate called [phone] to let us know that the patient passed away.  He had no details except patient name and address.

## 2016-11-28 ENCOUNTER — Ambulatory Visit: Payer: Medicare Other | Admitting: Vascular Surgery

## 2016-11-28 ENCOUNTER — Telehealth: Payer: Self-pay | Admitting: Family Medicine

## 2016-11-28 NOTE — Telephone Encounter (Signed)
Death Certificate has been dropped off by [facility] and placed in Dr. Johnathan Hausen box for completion. Please return to me once completed.

## 2016-12-01 DEATH — deceased

## 2016-12-03 ENCOUNTER — Ambulatory Visit: Payer: Medicare Other | Admitting: Oncology

## 2016-12-30 ENCOUNTER — Encounter: Payer: Self-pay | Admitting: *Deleted

## 2017-01-10 ENCOUNTER — Encounter: Payer: Self-pay | Admitting: *Deleted

## 2017-01-10 NOTE — Progress Notes (Signed)
Received letter from [facility] that patient had mail returned to them indicating he was deceased, and they are requesting info to verify that he is deceased.  Letter sent, along with death certificate, indicating patient was deceased on 12-03-16.  Mailed to [employer] (Attn: Billing). Burna Forts, BSN, RN-BC

## 2017-03-12 IMAGING — CT CT ABD-PELV W/O CM
2 of 4 series · 16 of 46 positions shown, 18 images · non-contrast
Comparison: None.

CLINICAL DATA: Urinary retention for 2 weeks

EXAM:
CT ABDOMEN AND PELVIS WITHOUT CONTRAST
TECHNIQUE: Multidetector CT imaging of the abdomen and pelvis was performed
following the standard protocol without IV contrast.

[Series 3: a/p w/o 5mm · axial · non-contrast · 0.67mm/px · z∈[+814,+1229]mm · 13 of 91 slices shown, 15 images]
[im 4/91  soft-tissue]
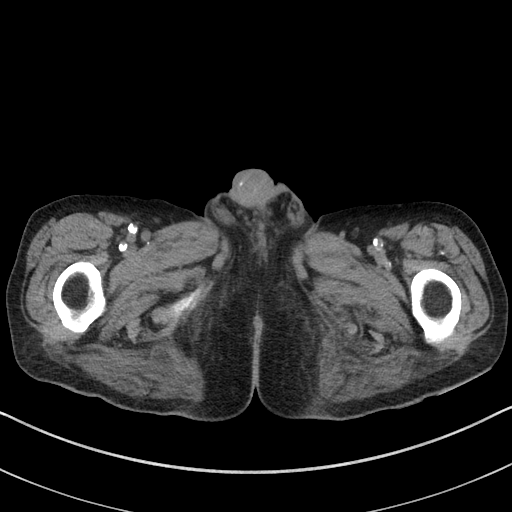
[im 4/91  bone]
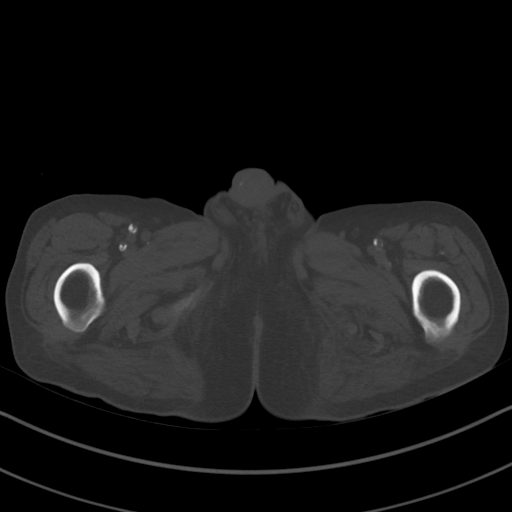
[im 11/91  soft-tissue]
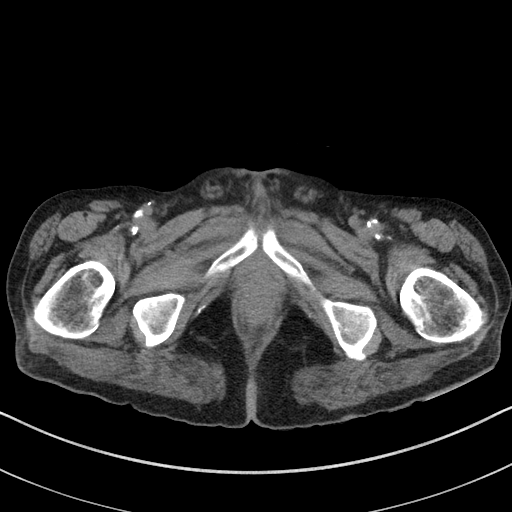
[im 19/91  soft-tissue]
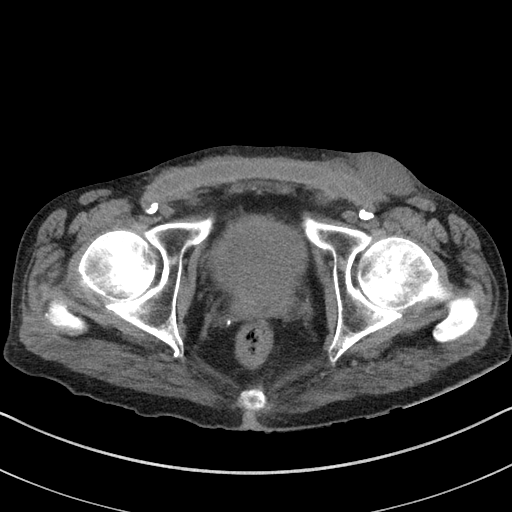
[im 26/91  soft-tissue]
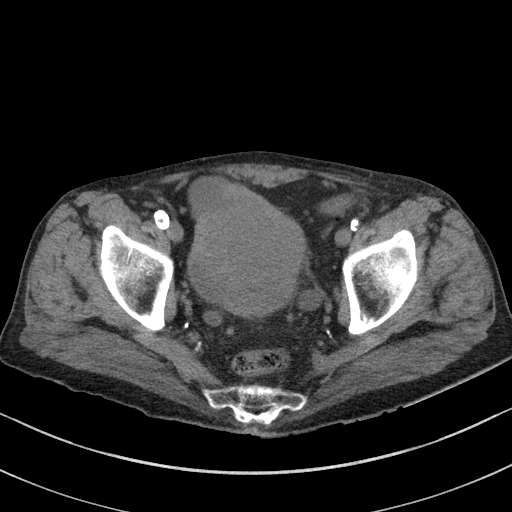
[im 33/91  soft-tissue]
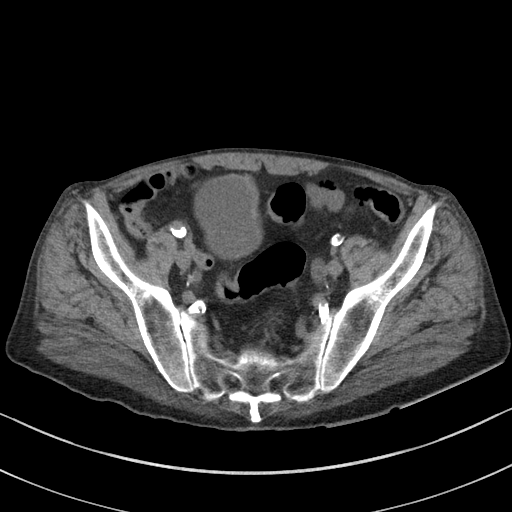
[im 40/91  soft-tissue]
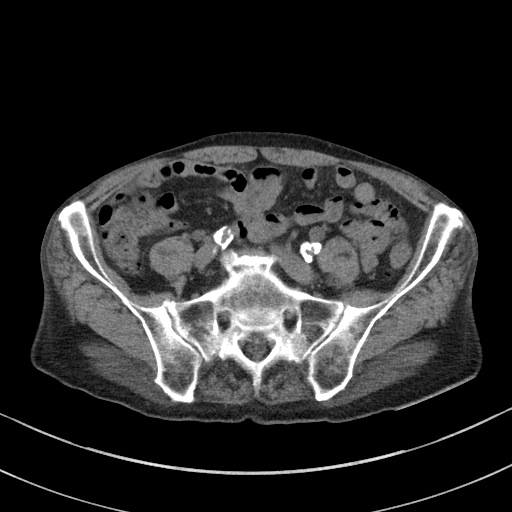
[im 47/91  soft-tissue]
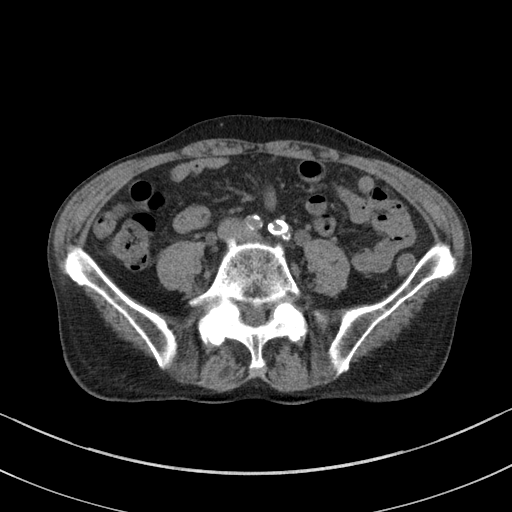
[im 51/91  soft-tissue]
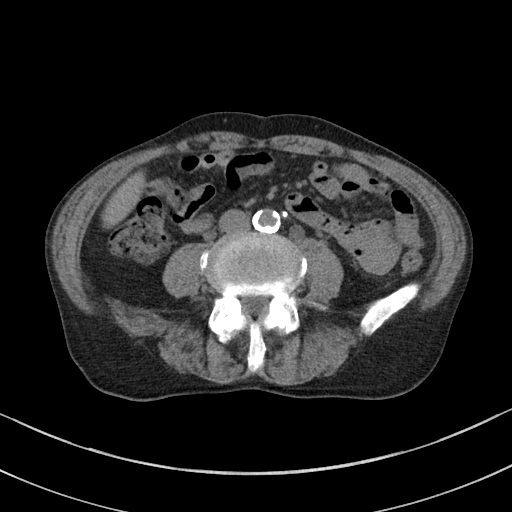
[im 58/91  soft-tissue]
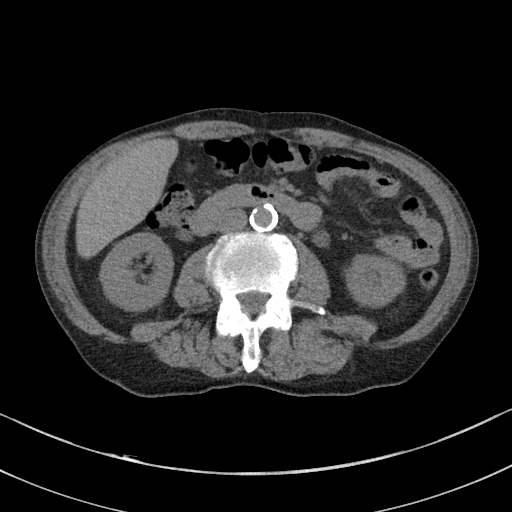
[im 58/91  bone]
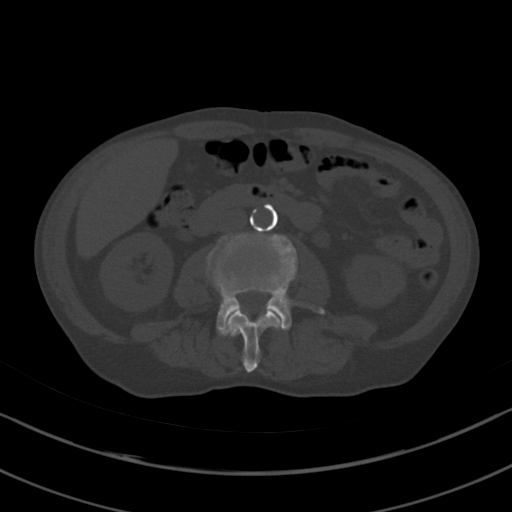
[im 65/91  soft-tissue]
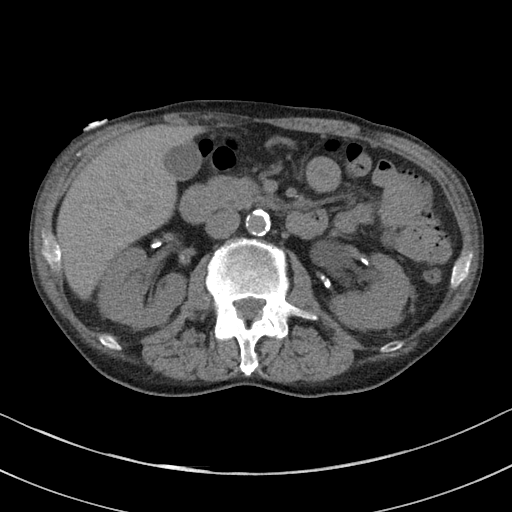
[im 73/91  soft-tissue]
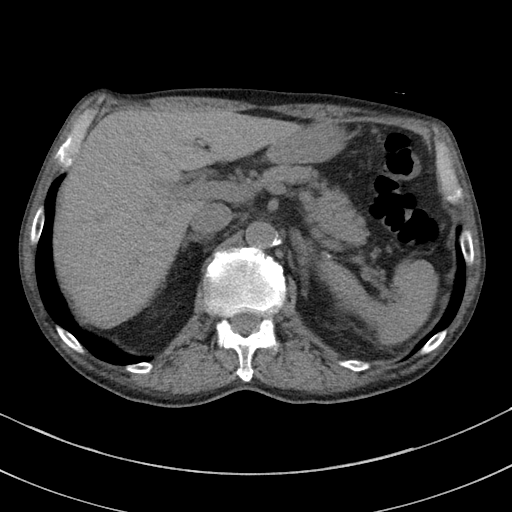
[im 80/91  soft-tissue]
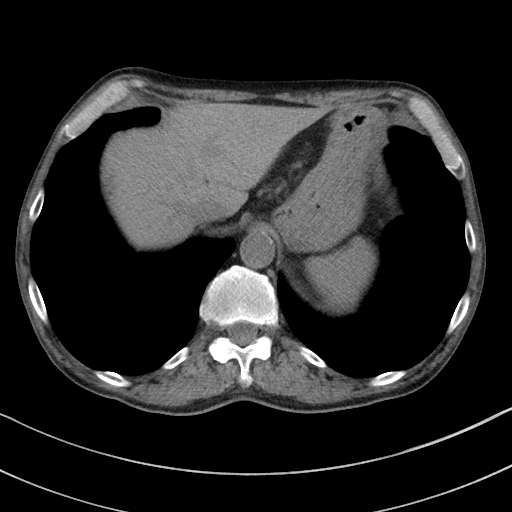
[im 87/91  soft-tissue]
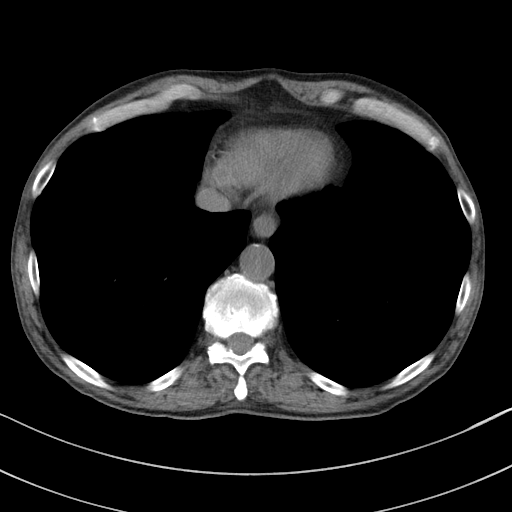

[Series 6: a/p w/o cor · coronal · non-contrast · 0.65mm/px · 3 of 127 slices shown]
[im 43/127  soft-tissue]
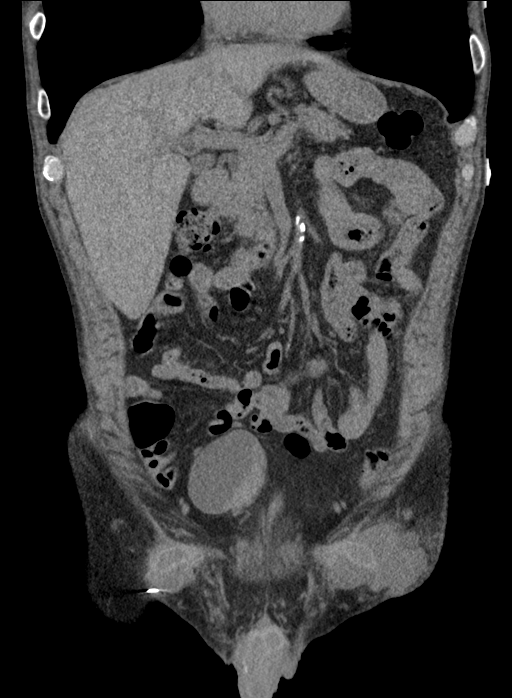
[im 57/127  soft-tissue]
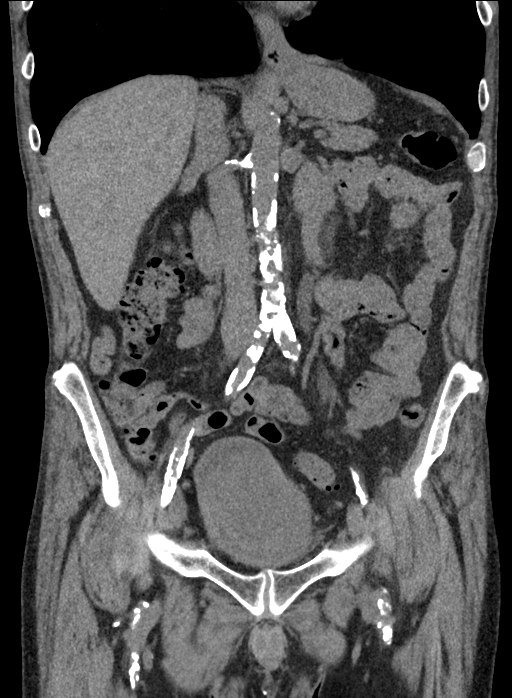
[im 71/127  soft-tissue]
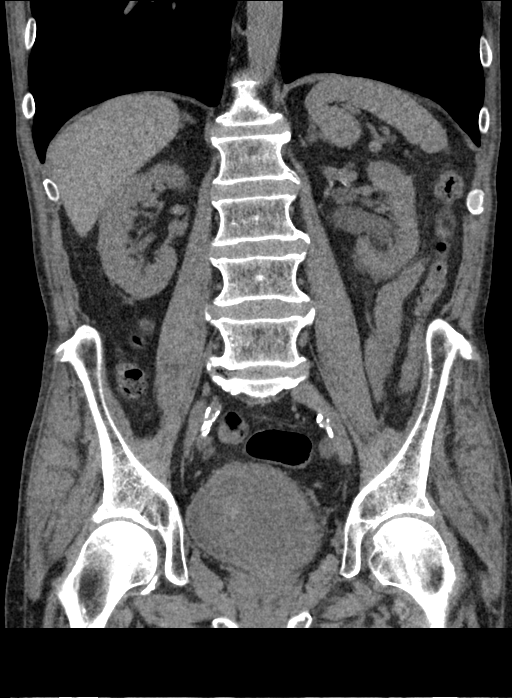

[16 of 46 positions shown; findings below may reference images not displayed]

FINDINGS: Lower chest: No acute abnormality.

Hepatobiliary: No focal liver abnormality is seen. No gallstones,
gallbladder wall thickening, or biliary dilatation.

Pancreas: Unremarkable. No pancreatic ductal dilatation or
surrounding inflammatory changes.

Spleen: Normal in size without focal abnormality.

Adrenals/Urinary Tract: Both adrenals are normal.

No significant renal parenchymal lesions.

There is marked hydronephrosis and ureteral dilatation bilaterally.
The urinary bladder is markedly irregular and contains a mass, blood
clot or both. The mass/clot may be as large as 7 cm.

Stomach/Bowel: Small hiatal hernia. Stomach and small bowel are
otherwise unremarkable. Appendix is normal. Colon is remarkable only
for mild uncomplicated diverticulosis.

Vascular/Lymphatic: The abdominal aorta is normal in caliber with
extensive atherosclerotic calcification. The major branches of the
aorta are also heavily calcified.

There is a femoral-femoral graft, the patency of which cannot be
determined without intravenous contrast. There is fluid around the
graft and there also is a fluid collection in the left inguinal
region.

No pathologic adenopathy is evident in the abdomen or pelvis.

Reproductive: New unremarkable

Other: No ascites.

Musculoskeletal: No significant skeletal lesions.
IMPRESSION: 1. Abnormal soft tissue within the urinary bladder lumen consistent
with mass or clot or both.
2. Marked hydronephrosis and hydroureter bilaterally.
3. Extensive atherosclerotic calcification throughout the normal
caliber aorta and its major branches.
4. Fluid surrounding the femoral-femoral graft. Fluid collections in
the inguinal regions near the graft anastomoses, left larger than
right.
5. Hiatal hernia
6. Diverticulosis

## 2017-09-25 ENCOUNTER — Encounter (HOSPITAL_COMMUNITY): Payer: Medicare Other

## 2017-09-25 ENCOUNTER — Other Ambulatory Visit (HOSPITAL_COMMUNITY): Payer: Medicare Other

## 2017-09-25 ENCOUNTER — Ambulatory Visit: Payer: Medicare Other | Admitting: Family
